# Patient Record
Sex: Male | Born: 1935 | Hispanic: No | Marital: Married | Smoking: Former smoker
Health system: Southern US, Community
[De-identification: ages and names within clinical notes are randomized; demographics above are authoritative.]

## PROBLEM LIST (undated history)

## (undated) DIAGNOSIS — R000 Tachycardia, unspecified: Secondary | ICD-10-CM

## (undated) DIAGNOSIS — I10 Essential (primary) hypertension: Secondary | ICD-10-CM

## (undated) DIAGNOSIS — E8881 Metabolic syndrome: Secondary | ICD-10-CM

## (undated) DIAGNOSIS — Z950 Presence of cardiac pacemaker: Secondary | ICD-10-CM

## (undated) DIAGNOSIS — I471 Supraventricular tachycardia, unspecified: Secondary | ICD-10-CM

## (undated) DIAGNOSIS — E785 Hyperlipidemia, unspecified: Secondary | ICD-10-CM

## (undated) DIAGNOSIS — R7303 Prediabetes: Secondary | ICD-10-CM

## (undated) DIAGNOSIS — D649 Anemia, unspecified: Secondary | ICD-10-CM

## (undated) DIAGNOSIS — I495 Sick sinus syndrome: Secondary | ICD-10-CM

## (undated) DIAGNOSIS — G4730 Sleep apnea, unspecified: Secondary | ICD-10-CM

## (undated) DIAGNOSIS — M353 Polymyalgia rheumatica: Secondary | ICD-10-CM

## (undated) DIAGNOSIS — I4891 Unspecified atrial fibrillation: Secondary | ICD-10-CM

## (undated) DIAGNOSIS — K922 Gastrointestinal hemorrhage, unspecified: Secondary | ICD-10-CM

## (undated) HISTORY — DX: Gastrointestinal hemorrhage, unspecified: K92.2

## (undated) HISTORY — DX: Metabolic syndrome: E88.810

## (undated) HISTORY — DX: Metabolic syndrome: E88.81

## (undated) HISTORY — DX: Tachycardia, unspecified: R00.0

## (undated) HISTORY — DX: Supraventricular tachycardia, unspecified: I47.10

## (undated) HISTORY — DX: Polymyalgia rheumatica: M35.3

## (undated) HISTORY — DX: Unspecified atrial fibrillation: I48.91

## (undated) HISTORY — DX: Hyperlipidemia, unspecified: E78.5

## (undated) HISTORY — DX: Supraventricular tachycardia: I47.1

## (undated) HISTORY — DX: Sick sinus syndrome: I49.5

---

## 1980-08-27 HISTORY — PX: CARPAL TUNNEL RELEASE: SHX101

## 1995-08-28 HISTORY — PX: KNEE SURGERY: SHX244

## 1998-08-27 HISTORY — PX: CARPAL TUNNEL RELEASE: SHX101

## 2006-01-22 ENCOUNTER — Ambulatory Visit: Payer: Self-pay | Admitting: Internal Medicine

## 2006-01-28 ENCOUNTER — Ambulatory Visit: Payer: Self-pay | Admitting: Internal Medicine

## 2006-04-24 ENCOUNTER — Ambulatory Visit: Payer: Self-pay | Admitting: Internal Medicine

## 2006-05-10 ENCOUNTER — Ambulatory Visit: Payer: Self-pay | Admitting: Internal Medicine

## 2006-11-12 ENCOUNTER — Ambulatory Visit: Payer: Self-pay | Admitting: Internal Medicine

## 2007-01-06 ENCOUNTER — Ambulatory Visit: Payer: Self-pay

## 2007-04-04 ENCOUNTER — Ambulatory Visit: Payer: Self-pay | Admitting: Internal Medicine

## 2007-05-28 ENCOUNTER — Other Ambulatory Visit: Payer: Self-pay

## 2007-05-28 ENCOUNTER — Ambulatory Visit: Payer: Self-pay | Admitting: Emergency Medicine

## 2007-06-04 ENCOUNTER — Ambulatory Visit: Payer: Self-pay | Admitting: Internal Medicine

## 2007-08-11 ENCOUNTER — Ambulatory Visit: Payer: Self-pay | Admitting: Family Medicine

## 2007-08-28 ENCOUNTER — Ambulatory Visit: Payer: Self-pay | Admitting: Family Medicine

## 2007-09-28 ENCOUNTER — Ambulatory Visit: Payer: Self-pay | Admitting: Family Medicine

## 2007-12-29 ENCOUNTER — Ambulatory Visit: Payer: Self-pay | Admitting: Internal Medicine

## 2007-12-29 LAB — CONVERTED CEMR LAB
Hemoglobin: 14.3 g/dL (ref 13.0–17.0)
Lymphocytes Relative: 21 % (ref 12–46)
Lymphs Abs: 1.5 10*3/uL (ref 0.7–4.0)
Monocytes Absolute: 0.5 10*3/uL (ref 0.1–1.0)
Monocytes Relative: 7 % (ref 3–12)
Neutro Abs: 5.1 10*3/uL (ref 1.7–7.7)
RBC: 4.82 M/uL (ref 4.22–5.81)

## 2008-01-07 ENCOUNTER — Ambulatory Visit: Payer: Self-pay | Admitting: Internal Medicine

## 2008-01-07 ENCOUNTER — Encounter: Payer: Self-pay | Admitting: Family Medicine

## 2008-01-07 ENCOUNTER — Ambulatory Visit: Payer: Self-pay

## 2008-02-13 ENCOUNTER — Ambulatory Visit: Payer: Self-pay | Admitting: Family Medicine

## 2008-04-15 ENCOUNTER — Ambulatory Visit: Payer: Self-pay | Admitting: Internal Medicine

## 2008-04-16 ENCOUNTER — Encounter: Payer: Self-pay | Admitting: Internal Medicine

## 2008-04-16 ENCOUNTER — Ambulatory Visit: Payer: Self-pay | Admitting: Cardiovascular Disease

## 2008-04-16 LAB — CONVERTED CEMR LAB
ALT: 14 units/L (ref 0–53)
AST: 24 units/L (ref 0–37)
BUN: 19 mg/dL (ref 6–23)
Calcium: 8.9 mg/dL (ref 8.4–10.5)
Creatinine, Ser: 0.85 mg/dL (ref 0.40–1.50)
HDL: 46 mg/dL (ref >39)
Hgb A1c MFr Bld: 6 % (ref 4.6–6.1)
Total Bilirubin: 1 mg/dL (ref 0.3–1.2)
Total CHOL/HDL Ratio: 2.5
VLDL: 12 mg/dL (ref 0–40)

## 2008-06-01 ENCOUNTER — Ambulatory Visit: Payer: Self-pay | Admitting: Internal Medicine

## 2008-06-28 ENCOUNTER — Ambulatory Visit: Payer: Self-pay | Admitting: Internal Medicine

## 2008-07-07 ENCOUNTER — Ambulatory Visit: Payer: Self-pay

## 2008-07-07 ENCOUNTER — Encounter: Payer: Self-pay | Admitting: Internal Medicine

## 2008-07-08 ENCOUNTER — Ambulatory Visit: Payer: Self-pay | Admitting: Internal Medicine

## 2008-11-05 ENCOUNTER — Ambulatory Visit: Payer: Self-pay | Admitting: Family Medicine

## 2008-12-03 ENCOUNTER — Encounter (INDEPENDENT_AMBULATORY_CARE_PROVIDER_SITE_OTHER): Payer: Self-pay

## 2008-12-16 ENCOUNTER — Encounter: Payer: Self-pay | Admitting: Internal Medicine

## 2008-12-16 ENCOUNTER — Ambulatory Visit: Payer: Self-pay | Admitting: Internal Medicine

## 2008-12-16 DIAGNOSIS — I4891 Unspecified atrial fibrillation: Secondary | ICD-10-CM | POA: Insufficient documentation

## 2008-12-16 DIAGNOSIS — E785 Hyperlipidemia, unspecified: Secondary | ICD-10-CM

## 2008-12-17 ENCOUNTER — Ambulatory Visit: Payer: Self-pay | Admitting: Internal Medicine

## 2008-12-17 LAB — CONVERTED CEMR LAB
ALT: 15 units/L (ref 0–53)
AST: 27 units/L (ref 0–37)
CO2: 24 meq/L (ref 19–32)
Creatinine, Ser: 0.86 mg/dL (ref 0.40–1.50)
HCT: 42.2 % (ref 39.0–52.0)
LDL Cholesterol: 74 mg/dL (ref 0–99)
MCV: 89.2 fL (ref 78.0–100.0)
Platelets: 217 10*3/uL (ref 150–400)
RDW: 14 % (ref 11.5–15.5)
Sodium: 141 meq/L (ref 135–145)
Total Bilirubin: 1.1 mg/dL (ref 0.3–1.2)
Total CHOL/HDL Ratio: 3.2
Total Protein: 7.3 g/dL (ref 6.0–8.3)
VLDL: 13 mg/dL (ref 0–40)
WBC: 5.2 10*3/uL (ref 4.0–10.5)

## 2009-01-14 DIAGNOSIS — E8881 Metabolic syndrome: Secondary | ICD-10-CM

## 2009-01-19 ENCOUNTER — Ambulatory Visit: Payer: Self-pay | Admitting: Internal Medicine

## 2009-01-19 ENCOUNTER — Encounter: Payer: Self-pay | Admitting: Internal Medicine

## 2009-01-26 ENCOUNTER — Encounter: Payer: Self-pay | Admitting: Internal Medicine

## 2009-01-26 ENCOUNTER — Ambulatory Visit: Payer: Self-pay

## 2009-03-07 ENCOUNTER — Encounter: Payer: Self-pay | Admitting: Internal Medicine

## 2009-03-07 ENCOUNTER — Ambulatory Visit: Payer: Self-pay | Admitting: Internal Medicine

## 2009-03-11 ENCOUNTER — Encounter: Payer: Self-pay | Admitting: Cardiology

## 2009-03-21 ENCOUNTER — Telehealth: Payer: Self-pay | Admitting: Internal Medicine

## 2009-03-28 ENCOUNTER — Encounter: Payer: Self-pay | Admitting: Internal Medicine

## 2009-05-30 ENCOUNTER — Ambulatory Visit: Payer: Self-pay | Admitting: Internal Medicine

## 2009-06-06 ENCOUNTER — Ambulatory Visit: Payer: Self-pay | Admitting: Internal Medicine

## 2009-06-06 ENCOUNTER — Encounter: Payer: Self-pay | Admitting: Internal Medicine

## 2009-06-09 LAB — CONVERTED CEMR LAB
ALT: 19 units/L (ref 0–53)
AST: 30 units/L (ref 0–37)
Albumin: 4.2 g/dL (ref 3.5–5.2)
Alkaline Phosphatase: 103 units/L (ref 39–117)
Cholesterol: 115 mg/dL (ref 0–200)
HDL: 36 mg/dL — ABNORMAL LOW (ref >39)
Indirect Bilirubin: 0.7 mg/dL (ref 0.0–0.9)
Total Protein: 6.9 g/dL (ref 6.0–8.3)
Triglycerides: 62 mg/dL (ref <150)

## 2009-08-17 ENCOUNTER — Encounter: Payer: Self-pay | Admitting: Internal Medicine

## 2009-12-05 ENCOUNTER — Ambulatory Visit: Payer: Self-pay | Admitting: Internal Medicine

## 2009-12-05 ENCOUNTER — Encounter: Payer: Self-pay | Admitting: Internal Medicine

## 2009-12-07 ENCOUNTER — Ambulatory Visit: Payer: Self-pay | Admitting: Internal Medicine

## 2009-12-07 DIAGNOSIS — R609 Edema, unspecified: Secondary | ICD-10-CM | POA: Insufficient documentation

## 2009-12-14 ENCOUNTER — Ambulatory Visit: Payer: Self-pay | Admitting: Cardiovascular Disease

## 2009-12-14 ENCOUNTER — Encounter: Payer: Self-pay | Admitting: Internal Medicine

## 2009-12-16 LAB — CONVERTED CEMR LAB
ALT: 14 units/L (ref 0–53)
AST: 22 units/L (ref 0–37)
Alkaline Phosphatase: 92 units/L (ref 39–117)
BUN: 23 mg/dL (ref 6–23)
Calcium: 8.7 mg/dL (ref 8.4–10.5)
Chloride: 106 meq/L (ref 96–112)
Creatinine, Ser: 0.84 mg/dL (ref 0.40–1.50)
HDL: 37 mg/dL — ABNORMAL LOW (ref >39)
Total Bilirubin: 1 mg/dL (ref 0.3–1.2)
Total CHOL/HDL Ratio: 3.1
VLDL: 12 mg/dL (ref 0–40)

## 2010-05-09 ENCOUNTER — Ambulatory Visit: Payer: Self-pay | Admitting: Family Medicine

## 2010-06-05 ENCOUNTER — Telehealth (INDEPENDENT_AMBULATORY_CARE_PROVIDER_SITE_OTHER): Payer: Self-pay | Admitting: *Deleted

## 2010-06-20 ENCOUNTER — Encounter: Payer: Self-pay | Admitting: Internal Medicine

## 2010-06-20 ENCOUNTER — Ambulatory Visit: Payer: Self-pay

## 2010-06-23 ENCOUNTER — Telehealth: Payer: Self-pay | Admitting: Internal Medicine

## 2010-06-27 ENCOUNTER — Ambulatory Visit: Payer: Self-pay | Admitting: Cardiovascular Disease

## 2010-06-30 ENCOUNTER — Ambulatory Visit: Payer: Self-pay | Admitting: Internal Medicine

## 2010-07-10 ENCOUNTER — Telehealth (INDEPENDENT_AMBULATORY_CARE_PROVIDER_SITE_OTHER): Payer: Self-pay | Admitting: *Deleted

## 2010-07-11 ENCOUNTER — Ambulatory Visit: Payer: Self-pay | Admitting: Cardiovascular Disease

## 2010-07-11 ENCOUNTER — Ambulatory Visit: Payer: Self-pay

## 2010-07-11 ENCOUNTER — Encounter: Payer: Self-pay | Admitting: Cardiovascular Disease

## 2010-07-11 ENCOUNTER — Encounter (HOSPITAL_COMMUNITY)
Admission: RE | Admit: 2010-07-11 | Discharge: 2010-08-26 | Payer: Self-pay | Source: Home / Self Care | Attending: Internal Medicine | Admitting: Internal Medicine

## 2010-07-14 ENCOUNTER — Ambulatory Visit: Payer: Self-pay | Admitting: Internal Medicine

## 2010-07-17 LAB — CONVERTED CEMR LAB
Chloride: 105 meq/L (ref 96–112)
Potassium: 4.9 meq/L (ref 3.5–5.3)
Pro B Natriuretic peptide (BNP): 79 pg/mL (ref 0.0–100.0)
Sodium: 140 meq/L (ref 135–145)

## 2010-08-16 ENCOUNTER — Telehealth: Payer: Self-pay | Admitting: Internal Medicine

## 2010-09-26 NOTE — Progress Notes (Signed)
Summary: Nuclear pre procedure  Phone Note Outgoing Call Call back at [facility] Phone [phone]   Call placed by: Allen Kell, RT-N  July 10, 2010 3:17 PM Call placed to: Patient Summary of Call: Reviewed information on Myoview Information Sheet (see scanned document for further details).  Spoke with patient.      Nuclear Med Background Indications for Stress Test: Evaluation for Ischemia  Indications Comments: Abnormal Echo  History: Ablation, Echo, Pacemaker  History Comments: 10/11 Echo:EF=45-50%; h/o afib.     Nuclear Pre-Procedure Cardiac Risk Factors: History of Smoking, Lipids Height (in): 74   Appended Document: Cardiology Nuclear Testing please have them pull images for me so I can review in [place] and make a decision. thanks.

## 2010-09-26 NOTE — Progress Notes (Signed)
Summary: CALLED PT  Phone Note Outgoing Call Call back at [facility] Phone [phone]   Call placed by: Harlon Flor,  June 05, 2010 11:02 AM Call placed to: Patient Summary of Call: [facility] TO SCHEDULE ECHO Initial call taken by: Harlon Flor,  June 05, 2010 11:02 AM

## 2010-09-26 NOTE — Assessment & Plan Note (Signed)
Summary: F6M/AMD   Visit Type:  Follow-up Primary Provider:  Dr. Zada Finders  CC:  6 month reck..  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 Deno Lunger at [facility]. Followed by placement of permanent pacemaker.  December 2009 had a f/u ablation of a left-sided and right-sided ectopic atrial trachycardia. He now has permanent atrial fib.  Remainder of his history is notable for a previous GI bleed likely due to colonic AVMs, which were coiled by Interventional Radiology, polymyalgia rheumatica, metabolic syndrome, glucose intolerance, and hyperlipidemia.   He returns for routine f/u. Doing well. Walking regularly with no CP or SOB. Does have mild edema. Only complaint since we last saw him is sore R breast. Has had very throrough work-up and being followed by a Careers adviser. Has gained a few pounds since we last saw him. No palpitations, syncope or presyncope.   Echo last week EF 45-50% with global HK. RV normal. mild to moderate TR with PAH  ~77mmHG. Wife notices since he has gained weight he is back snoring more. No CP or dyspnea. Recent pacer check showed AV pacing 18% of time with episodes of AF with RVR. + headaches.  No longer taking lasix as it wasn't working for him.   Has not had recent stres test.   Current Medications (verified): 1)  Centrum Silver  Tabs (Multiple Vitamins-Minerals) .Marland Kitchen.. 1 By Mouth Daily 2)  Metamucil Plus Calcium  Caps (Psyllium-Calcium) .... 2 By Mouth Daily 3)  Omeprazole 20 Mg Cpdr (Omeprazole) .Marland Kitchen.. 1 By Mouth Daily 4)  Simvastatin 20 Mg Tabs (Simvastatin) .Marland Kitchen.. 1po Daily 5)  Lutein 6 Mg Caps (Lutein) .Marland Kitchen.. 1 By Mouth Daily 6)  Fish Oil 1000 Mg Caps (Omega-3 Fatty Acids) .... 2 By Mouth Daily 7)  Aspirin 325 Mg  Tabs (Aspirin) .Marland Kitchen.. 1 By Mouth Once Daily 8)  Metoprolol Succinate 100 Mg Xr24h-Tab (Metoprolol Succinate) .... Take One Tablet By Mouth Daily 9)   Furosemide 20 Mg Tabs (Furosemide) .... Take One Tablet By Mouth Daily. 10)  Klor-Con M20 20 Meq Cr-Tabs (Potassium Chloride Crys Cr) .Marland Kitchen.. 1 Tab Daily  Allergies (verified): No Known Drug Allergies  Past History:  Past Medical History: Last updated: 01/14/2009 SVT/ PSVT/ PAT (ICD-427.0) TACHYCARDIA (ICD-785) SICK SINUS/ TACHY-BRADY SYNDROME (ICD-427.81) METABOLIC SYNDROME X (ICD-277.7) HYPERLIPIDEMIA (ICD-272.4) ATRIAL FIBRILLATION (ICD-427.31) h/o GIB with colonic AVMs s/p coiling PMR  Past Surgical History: Last updated: 01/14/2009 R carpal tunnel - '82 L knee - '97 L carpal tunnel - '00  Family History: Last updated: 01/14/2009 Family History of Cancer:  Family History of Coronary Artery Disease:   Social History: Last updated: 01/14/2009 Retired  Married  Tobacco Use - Former. '76 Alcohol Use - no Regular Exercise - yes Drug Use - no  Risk Factors: Exercise: yes (01/14/2009)  Risk Factors: Smoking Status: quit (01/14/2009)  Review of Systems       As per HPI and past medical history; otherwise all systems negative.   Vital Signs:  Patient profile:   77 year old male Height:      74 inches Weight:      257 pounds BMI:     33.12 Pulse rate:   71 / minute BP sitting:   120 / 72  (right arm) Cuff size:   large  Vitals Entered By: Bishop Dublin, CMA (June 30, 2010 10:34 AM)  Physical Exam  General:  W ell appearing.  no resp difficulty HEENT: normal Neck: supple. JVP hard to see ? 8. Carotids 2+ bilat; no bruits. No lymphadenopathy or thryomegaly appreciated. Cor: PMI nondisplaced. Irregular rate & rhythm. No rubs, gallops, murmur. Lungs: clear Abdomen: soft, nontender, nondistended. No hepatosplenomegaly. No bruits or masses. Good bowel sounds. Extremities: no cyanosis, clubbing, rash, 2+ edema Neuro: alert & orientedx3, cranial nerves grossly intact. moves all 4 extremities w/o difficulty. affect pleasant    PPM Specifications Following  MD:  Sherryl Manges, MD     PPM Vendor:  Medtronic     PPM Model Number:  ADDR01     PPM Serial Number:  [id number] PPM DOI:  04/05/2008     PPM Implanting MD:  Sherryl Manges, MD  Lead 1    Location: RA     DOI: 04/05/2008     Model #: 7628     Serial #: [id number]     Status: active Lead 2    Location: RV     DOI: 04/05/2008     Model #: 3710     Serial #: [id number]     Status: active  Magnet Response Rate:  BOL 85 ERI  65  Indications:  SVT/Ablation   PPM Follow Up Pacer Dependent:  No      Episodes Coumadin:  No  Parameters Mode:  DDDR+     Lower Rate Limit:  60     Upper Rate Limit:  120 Paced AV Delay:  200     Sensed AV Delay:  180  Impression & Recommendations:  Problem # 1:  ATRIAL FIBRILLATION (ICD-427.31) Having episodes of AF with RVR. Increase toprol to 150 daily. Not coumadin candidate due to AVMs.  Problem # 2:  Abnormal echo Echo with mildly decreased LV function. Suspect may be due to AF with RVR.  Increasing toprol; will need stress test to exclude development of CAD.  Problem # 3:  Pulmonary HTN by echo Suspect combination of OSA and volume overload. Have suggested he lose 10-15 punds otherwise will need sleep study. Will prescribe lasix 40mg  daily on MWF with kcl 20. Check BMET and BNP in 2 weeks. Cut back on fluid intake. Watch renal function closely.   Patient Instructions: 1)  Your physician has recommended you make the following change in your medication: INCREASE metoprolol 150mg  daily START lasix and potassium M/W/F 2)  Your physician wants you to follow-up in:   3 months You will receive a reminder letter in the mail two months in advance. If you don't receive a letter, please call our office to schedule the follow-up appointment. 3)  Your physician has requested that you have an exercise stress myoview.  For further information please visit [email or web address]  Please follow instruction sheet, as given. 4)  Your physician recommends that you return for  lab work in: 2 weeks (BMP/BNP) Prescriptions: METOPROLOL SUCCINATE 100 MG XR24H-TAB (METOPROLOL SUCCINATE) Take 1 1/2  tablets by mouth daily  #135 x 3   Entered by:   Benedict Needy, RN   Authorized by:   Dolores Patty, MD, [facility]   Signed by:   Benedict Needy, RN on 06/30/2010   Method used:   Faxed to ...       [facility] Environmental education officer)       [address] [address]       [place], [place]  [address]       Ph: [phone]       Fax: [phone]   RxID:   [id number] FUROSEMIDE 40 MG  TABS (FUROSEMIDE) Take one tablet by mouth Monday, Wednesday and Friday.  #15 x 6   Entered by:   Benedict Needy, RN   Authorized by:   Dolores Patty, MD, [facility]   Signed by:   Benedict Needy, RN on 06/30/2010   Method used:   Electronically to        [facility]  [address] (retail)       [address]       [place], [place]  [address]       Ph: [phone]       Fax: [phone]   RxID:   [id number] KLOR-CON M20 20 MEQ CR-TABS (POTASSIUM CHLORIDE CRYS CR) Take 1 tablet by mouth monday, wednesday and friday.  #15 x 6   Entered by:   Benedict Needy, RN   Authorized by:   Dolores Patty, MD, [facility]   Signed by:   Benedict Needy, RN on 06/30/2010   Method used:   Electronically to        [facility]  [address] (retail)       [address]       [place], [place]  [address]       Ph: [phone]       Fax: [phone]   RxID:   [id number] METOPROLOL SUCCINATE 100 MG XR24H-TAB (METOPROLOL SUCCINATE) Take 1 1/2  tablets by mouth daily  #110 x 3   Entered by:   Benedict Needy, RN   Authorized by:   Dolores Patty, MD, [facility]   Signed by:   Benedict Needy, RN on 06/30/2010   Method used:   Printed then faxed to ...       [facility] Environmental education officer)       [address] [address]       [place], [place]  [address]       Ph: [phone]       Fax: [phone]   RxID:   [id number]

## 2010-09-26 NOTE — Cardiovascular Report (Signed)
Summary: Office Visit   Office Visit   Imported By: Roderic Ovens 12/13/2009 15:46:45  _____________________________________________________________________  External Attachment:    Type:   Image     Comment:   External Document

## 2010-09-26 NOTE — Assessment & Plan Note (Signed)
Summary: f48m   Visit Type:  Follow-up Primary Provider:  Dr. Zada Finders  CC:  no complaints.  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 Deno Lunger at [facility] about 2 years ago. Followed by placement of permanent pacemaker.  December 2009 had a f/u ablation of a left-sided and right-sided ectopic atrial trachycardia. He now has permanent atrial fib.  Remainder of his history is notable for a previous GI bleed likely due to colonic AVMs, which were coiled by Interventional Radiology, polymyalgia rheumatica, metabolic syndrome, glucose intolerance, and hyperlipidemia.   He returns for routine f/u. Doing well. Walking regularly with no CP or SOB. Does have mild edema. No orthopnea or PND. No palpitations. No presyncope. Recent pacer check showed chronic AF with VR > 120 only 6.6% of time.   Current Medications (verified): 1)  Centrum Silver  Tabs (Multiple Vitamins-Minerals) .Marland Kitchen.. 1 By Mouth Daily 2)  Metamucil Plus Calcium  Caps (Psyllium-Calcium) .... 2 By Mouth Daily 3)  Omeprazole 20 Mg Cpdr (Omeprazole) .Marland Kitchen.. 1 By Mouth Daily 4)  Simvastatin 20 Mg Tabs (Simvastatin) .Marland Kitchen.. 1po Daily 5)  Lutein 6 Mg Caps (Lutein) .Marland Kitchen.. 1 By Mouth Daily 6)  Fish Oil 1000 Mg Caps (Omega-3 Fatty Acids) .... 2 By Mouth Daily 7)  Aspirin 325 Mg  Tabs (Aspirin) .Marland Kitchen.. 1 By Mouth Once Daily 8)  Metoprolol Succinate 100 Mg Xr24h-Tab (Metoprolol Succinate) .... Take One Tablet By Mouth Daily  Allergies (verified): No Known Drug Allergies  Past History:  Past Medical History: Last updated: 01/14/2009 SVT/ PSVT/ PAT (ICD-427.0) TACHYCARDIA (ICD-785) SICK SINUS/ TACHY-BRADY SYNDROME (ICD-427.81) METABOLIC SYNDROME X (ICD-277.7) HYPERLIPIDEMIA (ICD-272.4) ATRIAL FIBRILLATION (ICD-427.31) h/o GIB with colonic AVMs s/p coiling PMR  Review of Systems       As per HPI and past medical history; otherwise all  systems negative.   Vital Signs:  Patient profile:   77 year old male Height:      74 inches Weight:      254.25 pounds BMI:     32.76 Pulse rate:   64 / minute Pulse rhythm:   regular BP sitting:   116 / 82  (left arm) Cuff size:   large  Vitals Entered By: Charlena Cross, RN, BSN (December 07, 2009 2:21 PM)  Physical Exam  General:  General:  Gen: well appearing. no resp difficulty HEENT: normal Neck: supple. no JVD. Carotids 2+ bilat; no bruits. No lymphadenopathy or thryomegaly appreciated. Cor: PMI nondisplaced. Irregular rate & rhythm. No rubs, gallops, murmur. Lungs: clear Abdomen: soft, nontender, nondistended. No hepatosplenomegaly. No bruits or masses. Good bowel sounds. Extremities: no cyanosis, clubbing, rash, 2+ edema Neuro: alert & orientedx3, cranial nerves grossly intact. moves all 4 extremities w/o difficulty. affect pleasant    PPM Specifications Following MD:  Sherryl Manges, MD     PPM Vendor:  [employer]     PPM Model Number:  ADDR01     PPM Serial Number:  [id number] [facility] PPM DOI:  04/05/2008     PPM Implanting MD:  Sherryl Manges, MD  Lead 1    Location: RA     DOI: 04/05/2008     Model #: 4098     Serial #: [id number]     Status: active Lead 2    Location: RV     DOI: 04/05/2008     Model #: 6213     Serial #: [id number]  Status: active  Magnet Response Rate:  BOL 85 ERI  65  Indications:  SVT/Ablation   PPM Follow Up Pacer Dependent:  No      Episodes Coumadin:  No  Parameters Mode:  DDDR+     Lower Rate Limit:  60     Upper Rate Limit:  120 Paced AV Delay:  200     Sensed AV Delay:  180  Impression & Recommendations:  Problem # 1:  ATRIAL FIBRILLATION (ICD-427.31) This is stable. Rate control is adequate. Not coumadin candidate due to GIB. Continue asa. Repeat echo in 6 months.  Problem # 2:  HYPERLIPIDEMIA (ICD-272.4) Due for repeat lipids and liver panel.   Problem # 3:  EDEMA (ICD-782.3) Likely dependent edema. lasix 20/kcl 20prn.  Check electrolytes next week. if taking more than 1x/week recheck electrolytes in 1 month.Keep feet elevated.  Other Orders: Future Orders: Echocardiogram (Echo) ... 05/29/2010  Patient Instructions: 1)  Your physician recommends that you schedule a follow-up appointment in: 6 months 2)  Your physician recommends that you return for lab work in:1 month if you are taking alot of lasix (please call to make this appt) 3)  Your physician has recommended you make the following change in your medication: start lasix 20 mg daily as needed,  take potassium with your lasix 4)  Your physician recommends that you return for a FASTING lipid profile: in 1 week (cmet, lipids) Prescriptions: KLOR-CON M20 20 MEQ CR-TABS (POTASSIUM CHLORIDE CRYS CR) 1 tab daily  #30 x 0   Entered by:   Charlena Cross, RN, BSN   Authorized by:   Dolores Patty, MD, [facility]   Signed by:   Charlena Cross, RN, BSN on 12/07/2009   Method used:   Electronically to        [facility]  [address] (retail)       [address]       [place], [place]  [address]       Ph: [phone]       Fax: [phone]   RxID:   [id number] FUROSEMIDE 20 MG TABS (FUROSEMIDE) Take one tablet by mouth daily.  #30 x 0   Entered by:   Charlena Cross, RN, BSN   Authorized by:   Dolores Patty, MD, [facility]   Signed by:   Charlena Cross, RN, BSN on 12/07/2009   Method used:   Electronically to        [facility] (retail)       [address]       [place], [place]  [address]       Ph: [phone]       Fax: [phone]   RxID:   [id number] SIMVASTATIN 20 MG TABS (SIMVASTATIN) 1po daily  #90 x 3   Entered by:   Charlena Cross, RN, BSN   Authorized by:   Dolores Patty, MD, [facility]   Signed by:   Charlena Cross, RN, BSN on 12/07/2009   Method used:   Print then Give to Patient   RxID:   [id number] SIMVASTATIN 20 MG TABS (SIMVASTATIN) 1po daily  #90 x  3   Entered by:   Charlena Cross, RN, BSN   Authorized by:   Dolores Patty, MD, [facility]   Signed by:   Charlena Cross, RN, BSN on 12/07/2009   Method used:   Faxed to .Marland KitchenMarland Kitchen  [facility] Environmental education officer)       [address] [address]       [place], [place]  [address]       Ph: [phone]       Fax: [phone]   RxID:   [id number]

## 2010-09-26 NOTE — Procedures (Signed)
Summary: PACER/AMD   Current Medications (verified): 1)  Centrum Silver  Tabs (Multiple Vitamins-Minerals) .Marland Kitchen.. 1 By Mouth Daily 2)  Metamucil Plus Calcium  Caps (Psyllium-Calcium) .... 2 By Mouth Daily 3)  Omeprazole 20 Mg Cpdr (Omeprazole) .Marland Kitchen.. 1 By Mouth Daily 4)  Simvastatin 20 Mg Tabs (Simvastatin) .Marland Kitchen.. 1po Daily 5)  Lutein 6 Mg Caps (Lutein) .Marland Kitchen.. 1 By Mouth Daily 6)  Fish Oil 1000 Mg Caps (Omega-3 Fatty Acids) .... 2 By Mouth Daily 7)  Aspirin 325 Mg  Tabs (Aspirin) .Marland Kitchen.. 1 By Mouth Once Daily 8)  Metoprolol Succinate 100 Mg Xr24h-Tab (Metoprolol Succinate) .... Take One Tablet By Mouth Daily 9)  Klor-Con M20 20 Meq Cr-Tabs (Potassium Chloride Crys Cr) .Marland Kitchen.. 1 Tab Daily  Allergies (verified): No Known Drug Allergies   PPM Specifications Following MD:  [name] [name]     PPM Vendor:  [employer]     PPM Model Number:  ADDR01     PPM Serial Number:  [id number] H PPM DOI:  04/05/2008     PPM Implanting MD:  [name] Manges, MD  Lead 1    Location: RA     DOI: 04/05/2008     Model #: 1093     Serial #: [id number]     Status: active Lead 2    Location: RV     DOI: 04/05/2008     Model #: 5427     Serial #: [id number]     Status: active  Magnet Response Rate:  BOL 85 ERI  65  Indications:  SVT/Ablation   PPM Follow Up Remote Check?  No Battery Voltage:  2.79 V     Battery Est. Longevity:  9 years     Pacer Dependent:  No       PPM Device Measurements Atrium  Amplitude: 2.0 mV, Impedance: 496 ohms,  Right Ventricle  Amplitude: 8.0 mV, Impedance: 650 ohms, Threshold: 0.875 V at 0.4 msec  Episodes MS Episodes:  3744     Percent Mode Switch:  95.1%     Coumadin:  No Ventricular High Rate:  6     Atrial Pacing:  17.7%     Ventricular Pacing:  17.7%  Parameters Mode:  DDDR+     Lower Rate Limit:  60     Upper Rate Limit:  120 Paced AV Delay:  200     Sensed AV Delay:  180 Tech Comments:  A-fib with RVR noted., - coumadin.  No parameter changes.  Device function normal.  I will  discuss this with Dr. Gala [name].  [name] [name] is scheduled to see him 11/4.  ROV 6 months [place] [facility]. [name] Harm, LPN  June 27, 2010 3:12 PM  .

## 2010-09-26 NOTE — Progress Notes (Signed)
Summary: Bedelia Person, MD  Bedelia Person, MD   Imported By: Harlon Flor 08/31/2009 08:59:53  _____________________________________________________________________  External Attachment:    Type:   Image     Comment:   External Document

## 2010-09-26 NOTE — Procedures (Signed)
Summary: pacer check   Current Medications (verified): 1)  Centrum Silver  Tabs (Multiple Vitamins-Minerals) .Marland Kitchen.. 1 By Mouth Daily 2)  Metamucil Plus Calcium  Caps (Psyllium-Calcium) .... 2 By Mouth Daily 3)  Omeprazole 20 Mg Cpdr (Omeprazole) .Marland Kitchen.. 1 By Mouth Daily 4)  Simvastatin 20 Mg Tabs (Simvastatin) .Marland Kitchen.. 1po Daily 5)  Lutein 6 Mg Caps (Lutein) .Marland Kitchen.. 1 By Mouth Daily 6)  Fish Oil 1000 Mg Caps (Omega-3 Fatty Acids) .... 2 By Mouth Daily 7)  Aspirin 325 Mg  Tabs (Aspirin) .Marland Kitchen.. 1 By Mouth Once Daily 8)  Metoprolol Succinate 100 Mg Xr24h-Tab (Metoprolol Succinate) .... Take One Tablet By Mouth Daily  Allergies (verified): No Known Drug Allergies   PPM Specifications Following MD:  Sherryl Manges, MD     PPM Vendor:  [employer]     PPM Model Number:  ADDR01     PPM Serial Number:  [id number] H PPM DOI:  04/05/2008     PPM Implanting MD:  Sherryl Manges, MD  Lead 1    Location: RA     DOI: 04/05/2008     Model #: 6962     Serial #: [id number]     Status: active Lead 2    Location: RV     DOI: 04/05/2008     Model #: 0102     Serial #: [id number]     Status: active  Magnet Response Rate:  BOL 85 ERI  65  Indications:  SVT/Ablation   PPM Follow Up Remote Check?  No Battery Voltage:  2.79 V     Battery Est. Longevity:  6 years     Pacer Dependent:  No       PPM Device Measurements Atrium  Amplitude: 1.0 mV, Impedance: 505 ohms,  Right Ventricle  Amplitude: 5.6 mV, Impedance: 623 ohms, Threshold: 0.75 V at 0.4 msec  Episodes MS Episodes:  1715     Percent Mode Switch:  99%     Coumadin:  No Ventricular High Rate:  3     Atrial Pacing:  17.1%     Ventricular Pacing:  16.8%  Parameters Mode:  DDDR+     Lower Rate Limit:  60     Upper Rate Limit:  120 Paced AV Delay:  200     Sensed AV Delay:  180 Tech Comments:  No parameter changes.  A-fib, chronic - coumadin.  6.6% heart rates > 120bpm.  There were 3 VHR episodes the longest 1:49 minutes.  He will follow up in 6 months. [name],  LPN  December 05, 2009 9:03 AM

## 2010-09-26 NOTE — Progress Notes (Signed)
Summary: CALLING BACK  Phone Note Call from Patient Call back at Home Phone [phone]   Caller: SELF Call For: BENSIMHON Summary of Call: RETURNING ERIKA'S PHONE CALL REGARDING ULTRASOUND RESULTS Initial call taken by: Harlon Flor,  June 23, 2010 2:27 PM  Follow-up for Phone Call        Attempted to call pt back.  LMOM TCB. Cloyde Reams RN  June 23, 2010 2:32 PM   Called spoke with pt advised ECHO results OK per Dr Gala Romney, EF low normal.  Pt aware.  Follow-up by: Cloyde Reams RN,  June 23, 2010 4:15 PM

## 2010-09-28 NOTE — Progress Notes (Signed)
Summary: Results  Phone Note Call from Patient Call back at Home Phone [phone]   Caller: Self Call For: Fines Kimberlin Summary of Call: Pt has not heard the results of last labs or Stress Test.  He has requested that I fax it to [facility] Attention: Dr. Zada Finders.  Fax #[phone] Initial call taken by: Harlon Flor,  August 16, 2010 11:29 AM  Follow-up for Phone Call        Please see stress test result note from 07/11/10 and call pt, thanks. Follow-up by: Lanny Hurst RN,  August 16, 2010 3:58 PM

## 2010-09-28 NOTE — Assessment & Plan Note (Signed)
Summary: Cardiology Nuclear Testing  Nuclear Med Background Indications for Stress Test: Evaluation for Ischemia  Indications Comments: Abnormal Echo  History: Ablation, Echo, Pacemaker  History Comments: 10/11 Echo:EF=45-50%; h/o afib.  Symptoms: DOE, Palpitations, Rapid HR    Nuclear Pre-Procedure Cardiac Risk Factors: History of Smoking, Lipids Caffeine/Decaff Intake: None NPO After: 9:00 PM Lungs: clear IV 0.9% NS with Angio Cath: 22g     IV Site: R Hand IV Started by: Irean Hong, RN Chest Size (in) 46     Height (in): 74 Weight (lb): 246 BMI: 31.70 Tech Comments: Held metoprolol 48 hrs.  Nuclear Med Study 1 or 2 day study:  1 day     Stress Test Type:  Treadmill/Lexiscan Reading MD:  Charlton Haws, MD     Referring MD:  D.Bensimhon Resting Radionuclide:  Technetium 24m Tetrofosmin     Resting Radionuclide Dose:  11 mCi  Stress Radionuclide:  Technetium 44m Tetrofosmin     Stress Radionuclide Dose:  33 mCi   Stress Protocol  Max Systolic BP: 136 mm Hg   Stress Test Technologist:  Milana Na, EMT-P     Nuclear Technologist:  Doyne Keel, CNMT  Rest Procedure  Myocardial perfusion imaging was performed at rest 45 minutes following the intravenous administration of Technetium 58m Tetrofosmin.  Stress Procedure  The patient received IV Lexiscan 0.4 mg over 15-seconds with concurrent low level exercise and then Technetium 34m Tetrofosmin was injected at 30-seconds while the patient continued walking one more minute.  There were no significant changes with Lexiscan.  Quantitative spect images were obtained after a 45 minute delay.  QPS Raw Data Images:  Motion worse on stress images Stress Images:  Apical thinning Rest Images:  Apical thinning Subtraction (SDS):  SDS 1 Transient Ischemic Dilatation:  1.07  (Normal <1.22)  Lung/Heart Ratio:  0.31  (Normal <0.45)  Quantitative Gated Spect Images QGS EDV:  132 ml QGS ESV:  65 ml QGS EF:  51 % QGS cine  images:  Apical hypokinesis  Findings Low risk nuclear study      Overall Impression  Exercise Capacity: Lexiscan with no exercise. BP Response: Normal blood pressure response. Clinical Symptoms: "Winded" ECG Impression: Low voltage Afib /Flutter Overall Impression: Low risk: no ischemia  Thinning of apex and inferior wall.  Apical hypokinesis seems prominant on surface images suggest echo or MRI correlation  Appended Document: Cardiology Nuclear Testing please have them pull images for me so I can review in [place] and make a decision. thanks.  Appended Document: Cardiology Nuclear Testing Dr Gala Romney reviewed images, he will think about it and let you know what to do  Appended Document: Cardiology Nuclear Testing I reviewed. Seems more like diaphragmatic attenuation. I feel it is low-risk. Continue current rx.   Appended Document: Cardiology Nuclear Testing Attempted to call pt, LMOM TCB /MES  Appended Document: Cardiology Nuclear Testing pt aware of results. /MES

## 2010-10-03 ENCOUNTER — Ambulatory Visit (INDEPENDENT_AMBULATORY_CARE_PROVIDER_SITE_OTHER): Payer: Medicare Other | Admitting: Internal Medicine

## 2010-10-03 ENCOUNTER — Encounter: Payer: Self-pay | Admitting: Internal Medicine

## 2010-10-03 DIAGNOSIS — R609 Edema, unspecified: Secondary | ICD-10-CM

## 2010-10-03 DIAGNOSIS — R9389 Abnormal findings on diagnostic imaging of other specified body structures: Secondary | ICD-10-CM

## 2010-10-03 DIAGNOSIS — I4891 Unspecified atrial fibrillation: Secondary | ICD-10-CM

## 2010-10-12 NOTE — Assessment & Plan Note (Signed)
Summary: F3M/AMD   Visit Type:  Follow-up Primary Provider:  Dr. Zada Finders  CC:  "Doing well".  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 Deno Lunger at [place]. Followed by placement of permanent pacemaker.  December 2009 had a f/u ablation of a left-sided and right-sided ectopic atrial trachycardia. He now has permanent atrial fib.  Remainder of his history is notable for a previous GI bleed likely due to colonic AVMs, which were coiled by Interventional Radiology, polymyalgia rheumatica, metabolic syndrome, glucose intolerance, and hyperlipidemia.   He returns for routine f/u. Doing well. Walking regularly with no CP or SOB. Does have mild edema. Only complaint since we last saw him is sore R breast. Has had very throrough work-up and being followed by a Careers adviser. Has gained a few pounds since we last saw him. No palpitations, syncope or presyncope.   Had echo last year with EF 45-50% with global HK. RV normal. mild to moderate TR with PAH  ~73mmHG. Had f/u nuclear study in November 2011. Showed EF 51% with thinning of apex and inferior wall. I reviewed and felt it was low risk scan.   Feels well walking every day. No CP or shortness of breath. + LE edema and takes lasix 3x/week. No rothopne or PND. No problems with ASA.   Current Medications (verified): 1)  Centrum Silver  Tabs (Multiple Vitamins-Minerals) .Marland Kitchen.. 1 By Mouth Daily 2)  Metamucil Plus Calcium  Caps (Psyllium-Calcium) .... 2 By Mouth Daily 3)  Omeprazole 20 Mg Cpdr (Omeprazole) .Marland Kitchen.. 1 By Mouth Daily 4)  Simvastatin 20 Mg Tabs (Simvastatin) .Marland Kitchen.. 1po Daily 5)  Lutein 6 Mg Caps (Lutein) .Marland Kitchen.. 1 By Mouth Daily 6)  Fish Oil 1000 Mg Caps (Omega-3 Fatty Acids) .... 2 By Mouth Daily 7)  Aspirin 325 Mg  Tabs (Aspirin) .Marland Kitchen.. 1 By Mouth Once Daily 8)  Metoprolol Succinate 100 Mg Xr24h-Tab (Metoprolol Succinate) .... Take 1 1/2   Tablets By Mouth Daily 9)  Klor-Con M20 20 Meq Cr-Tabs (Potassium Chloride Crys Cr) .... Take 1 Tablet By Mouth Monday, Wednesday and Friday. 10)  Furosemide 40 Mg Tabs (Furosemide) .... Take One Tablet By Mouth Monday, Wednesday and Friday.  Allergies (verified): No Known Drug Allergies  Past History:  Past Medical History: Last updated: 01/14/2009 SVT/ PSVT/ PAT (ICD-427.0) TACHYCARDIA (ICD-785) SICK SINUS/ TACHY-BRADY SYNDROME (ICD-427.81) METABOLIC SYNDROME X (ICD-277.7) HYPERLIPIDEMIA (ICD-272.4) ATRIAL FIBRILLATION (ICD-427.31) h/o GIB with colonic AVMs s/p coiling PMR  Past Surgical History: Last updated: 01/14/2009 R carpal tunnel - '82 L knee - '97 L carpal tunnel - '00  Family History: Last updated: 01/14/2009 Family History of Cancer:  Family History of Coronary Artery Disease:   Social History: Last updated: 01/14/2009 Retired  Married  Tobacco Use - Former. '76 Alcohol Use - no Regular Exercise - yes Drug Use - no  Risk Factors: Exercise: yes (01/14/2009)  Risk Factors: Smoking Status: quit (01/14/2009)  Review of Systems       As per HPI and past medical history; otherwise all systems negative.   Vital Signs:  Patient profile:   77 year old male Height:      74 inches Weight:      252 pounds BMI:     32.47 Pulse rate:   74 / minute BP sitting:   112 / 71  (left arm) Cuff size:   regular  Vitals Entered By: Bishop Dublin, CMA (October 03, 2010 4:10 PM)  Physical Exam  General:  Well appearing. no resp difficulty HEENT: normal Neck: supple. JVP hard to see ?6-7. Carotids 2+ bilat; no bruits. No lymphadenopathy or thryomegaly appreciated. Cor: PMI nondisplaced. Irregular rate & rhythm. No rubs, gallops, murmur. Lungs: clear Abdomen: soft, nontender, nondistended. No hepatosplenomegaly. No bruits or masses. Good bowel sounds. Extremities: no cyanosis, clubbing, rash, 1+ edema Neuro: alert & orientedx3, cranial nerves grossly intact.  moves all 4 extremities w/o difficulty. affect pleasant    PPM Specifications Following MD:  Mar Daring     PPM Vendor:  Medtronic     PPM Model Number:  [id number]     PPM Serial Number:  [id number] PPM DOI:  04/05/2008     PPM Implanting MD:  Sherryl Manges, MD  Lead 1    Location: RA     DOI: 04/05/2008     Model #: [id number]     Serial #: [id number]     Status: active Lead 2    Location: RV     DOI: 04/05/2008     Model #: [id number]     Serial #: [id number]     Status: active  Magnet Response Rate:  BOL 85 ERI  65  Indications:  SVT/Ablation   PPM Follow Up Pacer Dependent:  No      Episodes Coumadin:  No  Parameters Mode:  DDDR+     Lower Rate Limit:  60     Upper Rate Limit:  120 Paced AV Delay:  200     Sensed AV Delay:  180  Impression & Recommendations:  Problem # 1:  ABNORMAL ECHO We reviewed his stress test and I suspect inferior defect may be artifactual. In either case, the sutdy is low risk and without significant CP or dyspnea would not pursue cardiac cath at this time. Should he develop symptoms would have low threshold for cath.   Problem # 2:  ATRIAL FIBRILLATION (ICD-427.31) Chronic. Rate controlled. Continued current regimen.  Not coumadin candidate due to AVMs.  Problem # 3:  EDEMA (ICD-782.3) Slightly improved. Continue to take lasix aas needed. Consider compression hose.   Patient Instructions: 1)  Your physician recommends that you schedule a follow-up appointment in: 6 months 2)  Your physician recommends that you continue on your current medications as directed. Please refer to the Current Medication list given to you today.  Prevention & Chronic Care Immunizations   Influenza vaccine: Not documented    Tetanus booster: Not documented    Pneumococcal vaccine: Not documented    H. zoster vaccine: Not documented  Colorectal Screening   Hemoccult: Not documented    Colonoscopy: Not documented  Other Screening   PSA: Not documented   Smoking  status: quit  (01/14/2009)  Lipids   Total Cholesterol: 114  (12/14/2009)   LDL: 65  (12/14/2009)   LDL Direct: Not documented   HDL: 37  (12/14/2009)   Triglycerides: 62  (12/14/2009)    SGOT (AST): 22  (12/14/2009)   SGPT (ALT): 14  (12/14/2009)   Alkaline phosphatase: 92  (12/14/2009)   Total bilirubin: 1.0  (12/14/2009)  Self-Management Support :    Lipid self-management support: Not documented

## 2011-01-09 NOTE — Assessment & Plan Note (Signed)
[name] [name] OFFICE NOTE   NAME:[name], [name]                       MRN:          [id number]  DATE:06/01/2008                            DOB:          1931-10-23    INTERVAL HISTORY:  [name] [name] is a very pleasant 77 year old male with  history of tachybrady syndrome, status post ablation of a reentrant SVT  by Dr. Deno Lunger at [place].  This was complicated by a GI bleed due to  what sounds like colonic AVMs, which were coiled by Interventional  Radiology.  He is also status post Medtronic pacemaker couple of months  ago due to symptomatic sick sinus syndrome.   Remainder of his medical history is notable for polymyalgia rheumatica,  metabolic syndrome, glucose intolerance, and hyperlipidemia.  He returns  today for routine followup.   Overall, he is doing quite well.  He has noticed that his heart rate has  been mostly in the 100-115 range.  This is asymptomatic for him.  He has  increased his Toprol from 25 a day augmented to a 100 and really has not  noticed much of a decrease in his heart rate.  However, he feels fine.  He does not feel dizzy.  He is very active, walking once or twice a day  without any chest pain, shortness of breath, or dizziness.   CURRENT MEDICATIONS:  Multivitamin, Metamucil, Prilosec 20 a day,  simvastatin 20 a day, Lutein, Toprol-XL 100 a day, and fish oil.   PHYSICAL EXAMINATION:  GENERAL:  He is well appearing, in no acute  distress, ambulatory in the clinic without any respiratory difficulty.  VITAL SIGNS:  Blood pressure is 92/70, heart rate 64, and weight is 233.  HEENT:  Normal.  NECK:  Supple.  No JVD.  Carotids are 2+ bilaterally without bruits.  There is no lymphadenopathy or thyromegaly.  CARDIAC:  PMI is nondisplaced.  He has regular rate and rhythm.  No  murmurs, rubs, or gallops.  LUNGS:  Clear.  ABDOMEN:  Soft, nontender, and nondistended.  No hepatosplenomegaly.  No  bruits.  No masses.  Good bowel sounds.  EXTREMITIES:  Warm with no cyanosis or clubbing.  There is trace edema.  No rash.  NEURO:  Alert and oriented x3.  Cranial nerves II-XII are intact.  Moves  all 4 extremities without difficulty.  Affect is pleasant.   LABORATORY DATA:  EKG shows atrial pacing at a rate of 64 with  nonspecific T-wave flattening.   ASSESSMENT/PLAN:  1. Tachybrady syndrome.  He had a Medtronic representative interrogate      his device.  He does in fact have a mean heart rate that is between      100 and 115.  Occasionally, he does have some PACs, which cause him      to slow down, but then he picks right back up.  I am wondering      whether or not he may have an abnormal focus of tachycardia.  We      are going to go ahead and increase his Toprol 150 mg  a day and see      how he tolerates.  I have asked him to come back and see Dr. Graciela Husbands      in early November for further evaluation and recommendations.  2. Hyperlipidemia.  Lipids are well controlled.   DISPOSITION:  We will see him back in 6 months for routine followup.     Bevelyn Buckles. Bensimhon, MD  Electronically Signed    DRB/MedQ  DD: 06/01/2008  DT: 06/02/2008  Job #: [id number]

## 2011-01-09 NOTE — Assessment & Plan Note (Signed)
[name] OFFICE NOTE   NAME:[name] [name], [name] [name]                       MRN:          [id number]  DATE:04/15/2008                            DOB:          16-Apr-1932    INTERVAL HISTORY:  [name] is a very pleasant 77 year old male with a  history of tachybrady syndrome status post ablation of reentrant SVT by  Dr. Deno Lunger at [facility].  This was complicated by GI bleed due to what  sounds like a colonic AVMs, which were coiled by Interventional  Radiology.  He also has a history of polymyalgia rheumatica, metabolic  syndrome, glucose intolerance, and hyperlipidemia.  He returns today for  routine followup.   The last visit we saw him his monitor showed significant pauses and he  recently underwent pacemaker placement by Dr. Deno Lunger at [facility] about a  week and half ago, which was uncomplicated.  He is doing well.  He is  back to walking.  He denies any chest pain or shortness of breath.  There is no lower extremity edema, no orthopnea, or PND.   He notes that his heart rates have been staying in the 100-110 range  pretty regularly.  This has not caused him any problems.  He has not  felt to be significantly irregular.  He did stop his Niaspan recently as  he was not tolerating it.   CURRENT MEDICATIONS:  1. Multivitamin.  2. Metamucil.  3. Toprol-XL 25 a day.  4. Omeprazole 20 a day.  5. Simvastatin 20 a day.  6. Lutein.   PHYSICAL EXAMINATION:  GENERAL:  He is well appearing in no acute  distress.  He ambulates around the clinic without any respiratory  difficulty.  VITAL SIGNS:  Blood pressure is 96/70, heart rate is 110, and weight is  225.  HEENT:  Normal.  NECK:  Supple.  No JVD.  Carotids are 2+ bilaterally without bruits.  There is no lymphadenopathy or thyromegaly.  CARDIAC:  PMI is nondisplaced.  He is regular and tachycardic.  No  murmurs.  Pacemaker site looks good.  LUNGS:  Clear.  ABDOMEN:   Soft, nontender, and nondistended.  No hepatosplenomegaly.  No  bruits.  No masses.  Good bowel sounds.  EXTREMITIES:  Warm with no cyanosis, clubbing, or edema.  No rash.  NEURO:  Alert and oriented x3.  Cranial nerves II through XII are  intact.  Moves all four extremities without difficulty.  Affect is  pleasant.   EKG shows sinus tachycardia at a rate of 110 with a nonspecific  interventricular conduction delay.  There is left axis deviation with a  left anterior fascicular block.  T-wave inversions in III and aVF.   LABORATORY DATA:  TSH and T4 were normal while he was at [facility].  Creatinine was 0.8.  Hemoglobin was 14.4.   ASSESSMENT/PLAN:  1. Tachybrady syndrome.  He is status post pacemaker.  He does remain      mildly tachycardic, but this appears to be sinus tachycardia.  We      will go ahead and increase his Toprol  to 50 mg and see how he      tolerates that.  His blood pressure is a little bit on the low      side, so I told him to make sure he is well hydrated.  If he is      having problems with orthostasis, he can cut back on the Toprol.  2. Hyperlipidemia.  He is due for a repeat lipid check.  He is now off      his niacin.  I told him to consider taking 1-2 g a day of fish oil.   DISPOSITION:  We will see him back in 5 months for followup.  He will be  followed here in our Device Clinic for his Medtronic pacemaker.     Bevelyn Buckles. Bensimhon, MD  Electronically Signed    DRB/MedQ  DD: 04/15/2008  DT: 04/16/2008  Job #: [id number]

## 2011-01-09 NOTE — Letter (Signed)
Jan 19, 2009    Bevelyn Buckles. Bensimhon, MD  [address], [place] [address]   RE:  [name] [name], [name] [name]  MRN:  [id number]  /  DOB:  [birth year]   Dear Jesusita Oka:   Mr. Millirons comes in today for device followup for bradycardia.  He has  a history of recurrent arrhythmias.  As you know, having undergone slow  pathway modification by Dr. Deno Lunger down at [facility].  When we saw him  last fall, we identified a new atrial tachycardia that appeared to be  associated with intercurrent deterioration of LV function from the 60%  to the 40-45% range and he was referred back to [facility] for a second  ablation procedure.  This targeted a right and left-sided focus  apparently.  It was felt to have been successful.   As you noted, however, when you saw him in April, he had developed  atrial fibrillation.  Interrogation of his device today demonstrates  that this has gone back until right around Christmas time and he has  been at 100% of the time since then.  Over a 42% of heart rates are  faster than 100 beats per minute and 15% of them are faster than 120  beats per minute.  The patient has had no untoward symptoms associated  with this.   I should note also that the patient previously was on Toprol which was  discontinued at [facility] in the fall because it was felt that it was not  necessary for atrial tachycardia as it was going to be ablated.   Furthermore, you have had discussions with the patient and his wife  regarding anticoagulation.  He has a CHADS2 score probably of 0 at this  point and so aspirin was initiated.  Furthermore, he has a history of  bleeding in his bowels, related to AVMs and it was felt that he might be  high risk for Coumadin.   As noted, he has had no significant changes in his symptoms.   His other medications currently include aspirin 325, simvastatin, and  omeprazole.   PHYSICAL EXAMINATION:  VITAL SIGNS:  His blood pressure at the last  visit was elevated at 142/92 but  that was an anomaly.  Today, it was  normal.  NECK:  His neck veins were 6-7 cm.  BACK:  Without kyphosis.  LUNGS:  Clear.  HEART:  Sounds were irregular with a 2/6 murmur heard at the apex.  ABDOMEN:  Soft with active bowel sounds.  EXTREMITIES:  Femoral pulses were not examined.  Distal pulses were  trace.  There was trace peripheral edema.   Interrogation of his Medtronic pulse generator demonstrated the  aforementioned findings related to his rate.  His atrial impedance was  512 and ventricular impedance was 695, thresholds were 0.625 at 0.4 and  the V could not be assessed and the battery voltage 2.79 with an  estimated longevity of 7 years.  Fibrillation wave was 1.4, R-wave was  5.6.   IMPRESSION:  1. Atrial fibrillation, new onset, now persistent with a rapid      ventricular response.  2. Status post prior ablation x2.      a.     Atrioventricular nodal reentry.      b.     Atrial tachycardia x2.  3. Left ventricular dysfunction thought to be tachycardia mediated,      last assessed in November.  4. Thromboembolic risk factors notable for:      a.  Borderline age.      b.     Intermittent elevations of blood pressure.      c.     Question left ventricular function.  5. History of gastrointestinal bleeding related to arteriovenous      malformations.   DISCUSSION:  Dan, Mr. Senft has atrial fibrillation which is  persistent and I guess the decision has been made to leave him in it  given his anticoagulation issues.  I think that is reasonable given the  paucity of his symptoms.  However, I think it is essential that we  control his heart rate as you know, especially given our prior concern  about tachycardia-induced cardiomyopathy.  To that end, I have resumed  his Toprol at 50 mg a day.  We will recheck an echo today to see what  his LV function is and this may have some bearing on anticoagulation.   We will plan to see him again in 1 months' time to reassess  the adequacy  of our heart rate control with the intent than in 3 months or so to  reassess left ventricular function.   Thanks very much for allowing [place] to participate in his care.    Sincerely,      Duke Salvia, MD, [facility]  Electronically Signed    SCK/MedQ  DD: 01/19/2009  DT: 01/20/2009  Job #: [id number]   CC:    Bedelia Person, MD

## 2011-01-09 NOTE — Progress Notes (Signed)
[facility]                  [facility] OFFICE NOTE   NAME:[name] [name], [name] [name]                       MRN:          [id number]  DATE:03/07/2009                            DOB:          [birth year]    [name] is seen in followup for atrial fibrillation that is now  culminant following prior ablation attempts by Dr. Deno [name] at [facility].  The plan has been to try to control his rate.  At his last visit, we  began him on Toprol 50 mg a day.  Fortunately, this had little impact on  symptoms; specifically there has been no fatigue, shortness of breath,  or change in effort tolerance.   His other medications were unchanged.   PHYSICAL EXAMINATION:  VITAL SIGNS:  His blood pressure was 130/70.  His  pulse was 77.  His weight was 242.  GENERAL:  He was alert and oriented.  SKIN:  Warm and dry.  NECK:  His neck veins were flat.  LUNGS:  Clear.  HEART:  Sounds were irregular without murmurs or gallops.  ABDOMEN:  Soft.  EXTREMITIES:  No edema.   Interrogation of his pacemaker, however, demonstrated a relatively flat  heart rate excursion.  He still has approximately 30% of his beats  faster than 100 beats per minute.   IMPRESSION:  1. Atrial fibrillation with a rapid ventricular response.  2. Previous ablations.  3. Recently assessed left ventricular function, which was essentially      normal.  4. Previously implanted Medtronic pacemaker in the A-D mode.   [name] is doing pretty well and he has no symptoms.  The concern  that I have is whether his rapid rate will have an impact on his LV  function.  We will plan to reassess this in 3 months' time.  If there is  persistent rapid rates despite the up titration of his Toprol which we  will accomplish today by changing from 50-100 mg, we will plan to get an  echo prior to him seeing Dr. Gala [name], so as to make a decision as to  whether anything further needs to be done.     [name] Salvia, MD, [facility]  Electronically Signed    SCK/MedQ  DD: 03/07/2009  DT: 03/08/2009  Job #: [id number]

## 2011-01-09 NOTE — Assessment & Plan Note (Signed)
[name] OFFICE NOTE   NAME:[name] [name], [name] [name]                       MRN:          [id number]  DATE:06/04/2007                            DOB:          12/19/1931    INTERVAL HISTORY:  Mr. Campusano is a delightful 77 year old male with a  history of sick sinus syndrome, with ST and bradycardia.  He also has a  history of metabolic syndrome and glucose intolerance and  hyperlipidemia.  He returns today for routine followup.   Since we last saw him, he has had quite a difficult time.  Several weeks  ago he had some lip numbness, concerning for a TIA, then an MRI of the  brain which was okay.  There was some mild plaquing in his left carotid,  but this was not flow-limiting.  He also developed proximal muscle  soreness and weakness and he was diagnosed with polymyalgia rheumatica  and has been started on steroids with a good effect.  Last week he had  an episode of severe palpitations and chest pressure.  [facility] and was found to be in an SVT with a heart rate of about 190.  He  was treated with adenosine and this did not initially break the rhythm,  but he says that about five to seven minutes later, he converted to a  normal rhythm.  He has not had an episode since, but has noted that his  heart rate has been somewhat faster. He continues to walk two miles a  day.  We did put a Holter monitor on him back in May.  This showed a  sinus bradycardia with a mean rate of 60.  He had frequent PACs and  occasional brief runs of SVT.  His minimum heart rate was 37 beats per  minute with multiple periods of bradycardia.  We did discuss a possible  pacemaker at that time, as we have done previously, but as he was not  overly symptomatically, we did not proceed.   He denies any significant lower extremity edema.  He has not had any  orthopnea or PND.   CURRENT MEDICATIONS:  1. Niaspan 1000 mg daily.  2.  Aspirin 81 mg.  3. Selenium.  4. Lutein.  5. Multivitamin.  6. Metamucil two daily.  7. Toprol XL 25 mg daily.  8. Simvastatin 20 mg, on hold.  9. Lisinopril 20 mg daily.  10.Prednisone 15 mg daily.  11.Prilosec 20 mg daily.   PHYSICAL EXAMINATION:  GENERAL:  He is well-appearing, in no acute  distress.  He ambulates around the clinic without any respiratory  difficulty.  VITAL SIGNS:  Blood pressure 96/70, heart rate 111, weight 234 pounds.  HEENT:  Normal.  NECK:  Supple.  There is no jugular venous distention.  Carotids are 2+  bilaterally, without bruits.  There is no lymphadenopathy or  thyromegaly.  HEART:  PMI is non-displaced.  He is tachycardic and regular.  A soft  systolic ejection murmur at the right sternal border.  There is no  gallop.  LUNGS:  Clear.  ABDOMEN:  Soft, nontender, non-distended.  No hepatosplenomegaly, no  bruits.  No masses.  Good bowel sounds.  EXTREMITIES:  Warm with no clubbing, cyanosis or edema.  SKIN:  No rash.  NEUROLOGIC:  He is alert and oriented x3.  Cranial nerves II-XII  are  intact.  He moves all four extremities without difficulty.  Affect is  pleasant.   Electrocardiogram shows sinus tachycardia at a rate of 111 with no  significant ST-T wave abnormalities.   ASSESSMENT/PLAN:  1. Tachycardia/bradycardia syndrome:  It appears that he has had a      fairly significant episode of supraventricular tachycardia.  I      suspect this may be atrioventricular node reaction tachycardia.  He      is scheduled to see Dr. Clinton Sawyer at [facility] on Monday, and hopefully      then follow up with Dr. Callie Fielding in EP.  The question is      whether he will need a pacemaker, just to permit more beta blocker,      or would he be a candidate for catheter ablation?  I am a bit      puzzled as to why he is so tachycardic today.  He is usually quite      bradycardic.  He had some baseline labs at the [facility],      which he says were normal.   It does not appear that they checked      his thyroid.  Will go ahead and do that.  I think it would also be      reasonable to get an echocardiogram, but he said that he would      probably be able to get  this at [facility].  2. Chest pressure:  This was only in the setting of rapid      supraventricular tachycardia; however, it has been several years      since his most recent stress test, and I do think it would be      reasonable to proceed with an adenosine Myoview.  3. Hyperlipidemia:  He has stopped his Zocor, due to myalgias, but I      think this is more likely due to his polymyalgia rheumatica and I      have asked him to go back on his simvastatin.   DISPOSITION:  He will follow up with [facility] on Monday.  I have asked him  to also increase his Toprol to 25 mg twice daily, his blood pressure  tolerating.  We will see him back in several months for routine  followup.     Bevelyn Buckles. Bensimhon, MD  Electronically Signed    DRB/MedQ  DD: 06/04/2007  DT: 06/04/2007  Job #: [id number]   cc:   Clinton Sawyer, M.D.  Callie Fielding, M.D.

## 2011-01-09 NOTE — Assessment & Plan Note (Signed)
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  He also has a history of polymyalgia rheumatic, metabolic  syndrome, glucose intolerance and hyperlipidemia.  He returns today for  routine follow-up.   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  He was sent to [facility] and underwent  ablation by [name] [name].  Post ablation he did well but then  experienced severe lower GI bleed due to what sounds like a colonic AVM.  He underwent coiling by Interventional Radiology, apparently he had 7  units of blood transfused.  Since that time, he is doing well.  He is  very active, walking half an hour to an hour a day with no chest pain or  shortness of breath.  He denies palpitations.  However, he does note  that his heart rate is almost always in the 100-115 range.   CURRENT MEDICATIONS:  1. Niaspan 500 a day.  2. Lutein.  3. Metamucil.  4. Prednisone 6 mg a day.  5. Omeprazole 20 a day.  6. Niaspan 500 a day.  7. Simvastatin 20 a day.  8. He is not on beta blockers due to intermittent bradycardia.   PHYSICAL EXAMINATION:  GENERAL:  He is well-appearing, no acute  distress.  Ambulates around the clinic without any respiratory  difficulty.  VITAL SIGNS:  Blood pressure is 110/78, heart rate 116, weight 230.  HEENT:  Normal.  NECK:  Supple.  No JVD, carotid 2+ bilaterally without bruits.  No  lymphadenopathy or thyromegaly.  CARDIAC:  PMI is nondisplaced.  He is regular and tachycardic.  No  murmurs.  LUNGS:  Clear.  ABDOMEN:  Soft, nontender, nondistended.  No hepatosplenomegaly.   No  bruits, no masses.  Good bowel sounds.  EXTREMITIES:  Warm, no cyanosis, clubbing or edema.  No rash.  NEUROLOGICAL:  Alert and oriented x3.  Cranial nerves II-XII are intact.  Moves all four extremities without difficulty.  Affect is very pleasant.   STUDIES:  EKG shows sinus pack of 116, no ST-T wave abnormalities.   ASSESSMENT/PLAN:  1. Tachycardia.  I am not sure of the etiology of this.  He was also      tachycardia in the office the last time we saw him.  I suspect this      is just physiologic.  However, I do worry about precipitating      tachycardiac cardiomyopathy if this persists.  We will check a CBC      to make sure he is not anemic, TSH and echocardiogram to rule out      any possible effusion.  We will also put on a Holter monitor to see  what his mean heart rate is.  Unfortunately, we are unable to beta      block him due to his tachybrady syndrome and periods of significant      bradycardia.  If he is persistently tachycardiac, without any      easily reversible cause, I will refer him back to [name] [name].      At some point, may have to discuss the possibility of pacemaker to      prevent bradycardia while treating his tachycardia.  2. History of supraventricular tachycardia status post ablation.  3. Hyperlipidemia as followed by his primary care physicians.  His      lipids have been well controlled.  4. Hypertension.  This is under control.   DISPOSITION:  Will see him back in 4 months.  We will get back in touch  with him by phone to discuss the results of his testing.     [name] Buckles. Bensimhon, MD  Electronically Signed    DRB/MedQ  DD: 12/29/2007  DT: 12/29/2007  Job #: [id number]   cc:   [facility] [name] [name]  [facility] [name] [name]

## 2011-01-09 NOTE — Letter (Signed)
June 28, 2008    Bevelyn Buckles. Bensimhon, MD  [address], [place] [address]   RE:  [name] [name], [name] [name]  MRN:  [id number]  /  DOB:  01/06/1932   Dear Jesusita Oka,   It was a pleasure to meet Mr. Briggs today at your request.  He saw Dr.  Carron Curie down at [facility] yesterday.  As you know, he had a pacemaker  implanted in August 2009 because of sinus node dysfunction with heart  rates going from 30s and 40s up to the 150s.  In the wake of that, it is  apparently Dr. Carron Curie is feeling that he has an ectopic atrial focus.  He does not however have any attributable symptoms and so the  recommendation was to just live it alone.   He has undergone ablation for SVT in the past.  He had reentrant  tachycardia about a year ago, and Dr. Carron Curie ablated that without  event.   The patient's post discharge course; however, was complicated by major  GI bleed, which was finally successfully terminated with coil therapy  done by the Interventional Radiology Team at [facility].   The patient specifically has no complaints of chest pain, shortness of  breath, or peripheral edema.   MEDICATIONS:  Currently include Toprol, which has been down titrated  from 100-50 and with a targeted goal of 25.  I should note at this  juncture, that Dr. Carron Curie did not feel that Toprol had helped the  patient's rhythm.  His other medications include omeprazole,  simvastatin, and, lutein as well as fish oil.   He has no known drug allergies.   PHYSICAL EXAMINATION:  GENERAL:  He is a healthy-appearing Caucasian  male who is a little bit anxious about his rhythm issue.  VITAL SIGNS:  His weight is 233 which is stable over the last month, his  blood pressure is 112/72, his pulse is 110, it went to 65 transiently  and then has stayed up at the 110 range since.  HEENT:  No icterus or xanthoma.  NECK:  Veins were flat.  The carotids were brisk and full bilaterally  without bruits.  BACK:  Without kyphosis or  scoliosis.  LUNGS:  Clear.  HEART:  Sounds were regular, but rapid.  ABDOMEN:  Soft, but protuberant.  EXTREMITIES:  No edema.   Interrogation of his Medtronic adapter pulse generator demonstrates P-  wave of greater than 5.6 with an R-wave of 11, the atrial impedance was  536 with threshold of 1 volt at 0.4, the RV impedance was 695 with a  threshold 0.75 at 0.4.  Battery voltage was 2.79.  He is 43%  ventricularly paced and 4.7% atrial paced, the likelihood of which is  that when he is in sinus rhythm.  Heart rate histograms are notable that  most of his beats are greater than 100 beats per minute.   IMPRESSION:  1. Atrial tachycardia.  2. Sinus node dysfunction status post Medtronic adapta pulse generator      implantation.  3. Reentrant supraventricular tachycardia.  4. Anxiety related to atrial tachycardia.   Jesusita Oka, Mr. Coburn not having any attributable symptoms his atrial  tachycardia.  The reason to do something then would be if there is an  impact on his left ventricular function, so I have ordered an echo which  he is to get I think before he sees you next week.  In the event  that  he has evidence of left ventricular dysfunction.  I think it becomes  incumbent to direct therapy at his tachycardia.  He has very close  relation with Dr. Berneice Gandy, and I think it would at that point be worth  either you or me calling him to pass on that information.  I think when  Dennie Bible last saw him the discussion of catheter ablation had come up in the  event that symptoms progressed.   Thanks very much for asking [place] to see him.  I will be glad to follow  along whatever way you think is most helpful.    Sincerely,     [facility] Salvia, MD, [facility]  Electronically Signed   SCK/MedQ  DD: 06/28/2008  DT: 06/29/2008  Job #: [id number]

## 2011-01-09 NOTE — Assessment & Plan Note (Signed)
[name] OFFICE NOTE   NAME:Doolan, DWIGHT                       MRN:          [id number]  DATE:07/08/2008                            DOB:          Feb 24, 1932    INTERVAL HISTORY:  [name] is a very pleasant 77 year old male with a  history of tachy-brady syndrome status post ablation of a reentrant SVT  by Dr. Deno Lunger at [place].  He is also status post pacemaker placement.  Remainder of his history is notable for a previous GI bleed likely due  to colonic AVMs, which were being coiled by Interventional Radiology,  polymyalgia rheumatica, metabolic syndrome, glucose intolerance, and  hyperlipidemia.   He recently has been struggling with persistent tachycardia with heart  rates between a 100 and a 115.  He was seen at the [facility], and  it was thought that this was likely an high atrial tachycardia very near  to sinus.  His Toprol dose was decreased, and it was thought that he  might benefit from flecainide if it persisted.   From a functional point of view, he continues to walk every day without  any chest pain or dyspnea.  He is quite troubled by his tachycardia and  worried it might hurt his heart.  We got an echocardiogram, which showed  ejection fraction of 45-50% and mild RV dysfunction.  There was also  very mild aortic stenosis with a mean transaortic gradient of 5.1.   CURRENT MEDICATIONS:  1. Multivitamin.  2. Metamucil.  3. Prilosec 20 a day.  4. Simvastatin 20 a day.  5. Lutein  6. Fish oil 1 g a day.  7. Toprol 50 a day.   PHYSICAL EXAMINATION:  GENERAL:  He is well appearing, in no acute  distress, ambulatory around the clinic, without any respiratory  difficulty.  VITAL SIGNS:  Blood pressure is 106/70, heart rate is 94, weight is 236.  HEENT:  Normal.  NECK:  Supple.  No JVD.  Carotids are 2+ bilaterally without any bruits.  There is no lymphadenopathy or thyromegaly.  CARDIAC:   PMI is nondisplaced.  It is regular.  Soft systolic ejection  murmur at the right sternal border.  No rub.  LUNGS:  Clear.  ABDOMEN:  Soft, nontender, nondistended.  No hepatosplenomegaly.  No  bruits.  No masses.  Good bowel sounds.  EXTREMITIES:  Warm with no cyanosis, clubbing, or edema.  No rash.  NEURO:  Alert and oriented x3.  Cranial nerves II through XII are  intact.  Moves all 4 extremities without difficulty.  Affect is  pleasant.   EKG shows sinus rhythm with nonspecific T-wave abnormality at a rate of  94.   ASSESSMENT AND PLAN:  1. Tachycardia.  This has been evaluated by EP.  It appears that this      may explain an atrial tachycardia that he is in and out of;      however, it now appears that he may have a mild tachycardic-induced      cardiomyopathy.  Thus, I think we need to address this  more      definitively.  I had spoken with Dr. Deno Lunger at [facility],      and we will go ahead and set him up for evaluation there, possible      ablation.  2. Hyperlipidemia.  Continue statin.  Goal LDL is less than 70.   DISPOSITION:  We will see him back in several months after he follows up  with EP.     Bevelyn Buckles. Bensimhon, MD  Electronically Signed    DRB/MedQ  DD: 07/08/2008  DT: 07/09/2008  Job #: [id number]

## 2011-01-12 ENCOUNTER — Other Ambulatory Visit: Payer: Self-pay | Admitting: Internal Medicine

## 2011-01-12 NOTE — Assessment & Plan Note (Signed)
[facility]                              CARDIOLOGY OFFICE NOTE   NAME:Rabelo, DACE DENN              MRN:          [id number]  DATE:04/24/2006                            DOB:          11/23/1931    PATIENT IDENTIFICATION:  Mr. Cancro is a delightful 77 year old male who  returns for a routine followup.   PROBLEM:  1. History of supraventricular tachycardia.      a.     Previously evaluated by Dr. Jacqualin Combes, has been well-controlled       on beta blockers.      b.     A 48-hour Holter monitor in June of 2007 showed a sinus rhythm       with some early morning bradycardia with heart rates in the 30s and an       occasional 2-second pause.  There are multiple short runs of SVT but       no prolonged episodes.  Mean heart rate was 65 beats per minute       overall.  2. Hypertension/hyperlipidemia, consistent with metabolic syndrome,      currently on therapy.  3. Glucose intolerance.  4. Obesity, now with significant weight loss.  5. History of diverticulosis.   CURRENT MEDICATIONS:  1. AcipHex 20 mg a day.  2. Altace 5 a day.  3. Niaspan 1000 a day.  4. Aspirin 81.  5. Selenium.  6. Lutein.  7. Multivitamin.  8. Metamucil.  9. Toprol XL 25 a day.  10.Simvastatin 20 mg q.h.s.   INTERVAL HISTORY:  Mr. Stripling returns today for routine followup with his  wife.  He continues to do quite well.  He is walking two miles a day without  any chest pain or shortness of breath.  He has also been checking his blood  pressure regularly with his automated cuff which we have evaluated here  today and seems to be reasonably close to the office pressures.  He denies  any palpitations.  He has not had any syncope or pre-syncope.   PHYSICAL EXAMINATION:  GENERAL:  He is well-appearing, no acute distress,  ambulates around the clinic vigorously without any difficulty.  VITAL SIGNS:  Blood pressure is 110/65 with a heart rate of 64.  His weight  is  234 pounds.  HEENT:  Sclerae anicteric, EOMI.  There is no xanthelasma.  Mucous membranes  are moist.  NECK:  Supple.  There is no JVD.  Carotids are 2+ bilaterally without  bruits.  There is no lymphadenopathy or thyromegaly.  CARDIAC:  He is regular with no murmurs, rubs or gallops.  LUNGS:  Clear.  ABDOMEN:  Soft, nontender, nondistended.  No hepatosplenomegaly, no bruits,  no masses, good bowel sounds.  EXTREMITIES:  Warm with no cyanosis, clubbing or edema.  There is no  cyanosis or clubbing.  There is trace edema bilaterally.  Distal pulses are  strong.  NEUROLOGIC:  He is alert and oriented x3.  Cranial nerves 2-12 are intact.  He moves all four extremities without difficulty.   ASSESSMENT/PLAN:  1. Supraventricular tachycardia.  This is quite stable.  We will continue  his Toprol at 25 mg a day.  We will not titrate it up, as he has had      significant bradycardia at higher doses.  2. Hypertension, well controlled.  3. Hyperlipidemia.  He is due to have his cholesterol checked.  We will      also check a liver panel, continue current regimen for now.  Given our      aggressive efforts at risk reduction, I would like to see his LDL close      to 70 if not below.  4. Lower extremity edema.  This is mild.  I suspect it is probably due to      mild venous insufficiency.  In fact, only if it gets worse we can add a      diuretic.  5. Glucose intolerance.  We will recheck a hemoglobin A1c.  I did suggest      that he continue to try to lose weight with diet and exercise to get      his weight down to about 215 pounds.   DISPOSITION:  We will see him back in 6 months for routine followup.  We  will be getting yearly 48-hour Holter monitors to keep track of his SVT, as  this can be quite asymptomatic for him.                                Bevelyn Buckles. Bensimhon, MD    DRB/MedQ  DD:  04/24/2006  DT:  04/25/2006  Job #:  [id number]   cc:   Limmie Patricia @ [facility]

## 2011-01-12 NOTE — Assessment & Plan Note (Signed)
[facility] OFFICE NOTE   NAME:[name] [name], [name] [name]              MRN:          [id number]  DATE:11/12/2006                            DOB:          Apr 11, 1932    INTERVAL HISTORY:  [name] [name] is a delightful 77 year old male with a  history of sick sinus syndrome with SVT and bradycardia.  He also has a  history of metabolic syndrome and glucose intolerance.  He returns today  for routine followup.  He has been quite busy traveling back and forth  to [place] to take care of his in-laws.   He says he is doing well.  He continues to walk about 2 miles a day  without any chest pain or shortness of breath.  He has not had any  syncope or presyncope and no palpitations.   CURRENT MEDICATIONS:  1. Aciphex 20 mg a day.  2. Niacin 1000 a day.  3. Aspirin 81.  4. Selenium, lutein, multivitamin.  5. Toprol XL 25 a day.  6. Simvastatin 20 a day.  7. Lisinopril 20 a day.   PHYSICAL EXAMINATION:  He is well appearing, in no acute distress.  He  ambulates around the clinic without respiratory difficulty.  Blood  pressure is 106/70, heart rate is 47. Weight is 242.  HEENT:  Sclerae anicteric.  EOMI.  There is no xanthelasmas.  Mucous  membranes are moist.  NECK:  Supple, no JVD.  Carotids are 2+ bilaterally without bruits.  There is no lymphadenopathy or thyromegaly.  CARDIAC:  He is bradycardic and mildly irregular, with a soft systolic  murmur at the right sternal border.  There is no gallop.  LUNGS:  Clear.  ABDOMEN:  Soft, nontender, nondistended.  No hepatosplenomegaly, no  bruits, no masses.  Good bowel sounds.  EXTREMITIES:  Warm with no cyanosis, clubbing or edema, no rash.  NEURO:  He is alert and oriented x3.  Cranial nerves II-XII intact,  moves all 4 extremities without difficulty.   EKG shows sinus bradycardia at a rate of 47 with occasional PACs.  There  is incomplete right bundle branch block.   ASSESSMENT AND PLAN:  1. Sick sinus syndrome with history of SVT.  I told him that at some      point he would probably likely need a pacemaker.  We also did      discuss the possibility of switching his Toprol to Pindolol.  I      have suggested repeating his 48-hour Holter monitor, however he      would like to wait 2 months until things calm down otherwise.  I      told him this is okay as long as he is not symptomatic.  2. Hyperlipidemia/metabolic syndrome.  I will recheck his lipids      today.  I have encouraged him to keep consistent with his exercise      program.   DISPOSITION:  Return to clinic in 6 months.     Bevelyn Buckles. Bensimhon, MD  Electronically Signed    DRB/MedQ  DD: 11/12/2006  DT: 11/12/2006  Job #: [id number]

## 2011-01-12 NOTE — Telephone Encounter (Signed)
Does Dr Gala Romney want the pt to continue these medications?  If so, please call into [facility].

## 2011-01-12 NOTE — Telephone Encounter (Signed)
Does patient need to continue on Lasix and potassium?

## 2011-01-12 NOTE — Telephone Encounter (Signed)
Does patient need to continue on the following medications?

## 2011-01-15 ENCOUNTER — Other Ambulatory Visit: Payer: Self-pay

## 2011-01-15 MED ORDER — FUROSEMIDE 40 MG PO TABS: ORAL_TABLET | ORAL | Status: DC

## 2011-01-15 MED ORDER — OMEPRAZOLE 20 MG PO CPDR: 20.0000 mg | DELAYED_RELEASE_CAPSULE | Freq: Every day | ORAL | Status: DC

## 2011-01-15 MED ORDER — POTASSIUM CHLORIDE CRYS ER 20 MEQ PO TBCR: EXTENDED_RELEASE_TABLET | ORAL | Status: DC

## 2011-01-15 NOTE — Telephone Encounter (Signed)
Needs a refill for omeprazole 20 mg take one tablet daily 90 day supply with 3 refills to [facility]

## 2011-01-15 NOTE — Telephone Encounter (Signed)
Refill sent for lasix and potassium to [facility].

## 2011-02-02 ENCOUNTER — Encounter: Payer: Self-pay | Admitting: *Deleted

## 2011-02-27 ENCOUNTER — Telehealth: Payer: Self-pay | Admitting: Internal Medicine

## 2011-02-27 NOTE — Telephone Encounter (Signed)
Pt last seen 09/2010. Pt takes ASA 325mg , has chronic a fib, rate controlled, not on coumadin, only ASA. (not coumadin candidate). Can pt hold prior to eyelid surgery? Please advise.

## 2011-02-27 NOTE — Telephone Encounter (Signed)
Pt needs to come off of Aspirin 7-10 days before eyelid surgery on 7/27.  Please advise.

## 2011-02-28 NOTE — Telephone Encounter (Signed)
Ok to hold ASA for surgery if they require it.

## 2011-03-01 NOTE — Telephone Encounter (Signed)
Pt notified of msg below, he states that they do not need clearance letter sent.

## 2011-03-21 ENCOUNTER — Ambulatory Visit (INDEPENDENT_AMBULATORY_CARE_PROVIDER_SITE_OTHER): Payer: Medicare Other | Admitting: *Deleted

## 2011-03-21 DIAGNOSIS — I495 Sick sinus syndrome: Secondary | ICD-10-CM

## 2011-03-21 NOTE — Progress Notes (Signed)
Pacer check/ 

## 2011-03-30 ENCOUNTER — Ambulatory Visit: Payer: Medicare Other | Admitting: Cardiovascular Disease

## 2011-04-27 ENCOUNTER — Encounter: Payer: Self-pay | Admitting: Cardiovascular Disease

## 2011-05-01 ENCOUNTER — Telehealth: Payer: Self-pay

## 2011-05-01 ENCOUNTER — Encounter: Payer: Self-pay | Admitting: Cardiovascular Disease

## 2011-05-01 ENCOUNTER — Ambulatory Visit (INDEPENDENT_AMBULATORY_CARE_PROVIDER_SITE_OTHER): Payer: Medicare Other | Admitting: Cardiovascular Disease

## 2011-05-01 DIAGNOSIS — I4891 Unspecified atrial fibrillation: Secondary | ICD-10-CM

## 2011-05-01 DIAGNOSIS — E785 Hyperlipidemia, unspecified: Secondary | ICD-10-CM

## 2011-05-01 MED ORDER — SIMVASTATIN 20 MG PO TABS: 20.0000 mg | ORAL_TABLET | Freq: Every day | ORAL | Status: DC

## 2011-05-01 MED ORDER — METOPROLOL SUCCINATE ER 100 MG PO TB24: 150.0000 mg | ORAL_TABLET | Freq: Every day | ORAL | Status: DC

## 2011-05-01 NOTE — Telephone Encounter (Signed)
Duplicate refill sent at office visit with dr. Mariah Milling.

## 2011-05-01 NOTE — Progress Notes (Signed)
Patient ID: [name] [name], male    DOB: 12-Feb-1932, 77 y.o.   MRN: [id number]  HPI Comments: [name] [name] is a very pleasant 77 year old male with a history of tachy-brady syndrome status post ablation of a reentrant SVT by Dr. Deno Lunger at [facility]. Followed by placement of permanent pacemaker.  December 2009 had a f/u ablation of a left-sided and right-sided ectopic atrial trachycardia. He now has permanent atrial fib. previous GI bleed x3 likely due to colonic AVMs, which were coiled by Interventional Radiology,  Also with polymyalgia rheumatica, metabolic syndrome, glucose intolerance on prednisone, andhyperlipidemia.     He returns for routine f/u. Doing well. Walking regularly with no CP or SOB. Does have mild edema. No palpitations, syncope or presyncope.   Feels well walking every day. No CP or shortness of breath. + LE edema and takes lasix 3x/week. No problems with ASA.    Had echo last year with EF 45-50% with global HK. RV normal. mild to moderate TR with PAH  ~57mmHG. Had f/u nuclear study in November 2011. Showed EF 51% with thinning of apex and inferior wall.  low risk scan.     EKG shows atrial fibrillation with ventricular paced rhythm, rate 67 beats per minute    Outpatient Encounter Prescriptions as of 05/01/2011  Medication Sig Dispense Refill  . aspirin 325 MG tablet Take 325 mg by mouth daily.        . fish oil-omega-3 fatty acids 1000 MG capsule Take 2 g by mouth daily.        . furosemide (LASIX) 40 MG tablet Take one tablet on Monday, Wednesday and Friday.  45 tablet  3  . Lutein 6 MG CAPS Take 1 capsule by mouth daily.        . metoprolol (TOPROL-XL) 100 MG 24 hr tablet Take 150 mg by mouth daily.        . Multiple Vitamins-Minerals (CENTRUM SILVER PO) Take by mouth daily.        Marland Kitchen omeprazole (PRILOSEC) 20 MG capsule Take 1 capsule (20 mg total) by mouth daily.  90 capsule  3  . potassium chloride SA (K-DUR,KLOR-CON) 20 MEQ tablet Take one tablet on Mondays, Wednesday  and Fridays.  45 tablet  3  . Psyllium-Calcium (METAMUCIL PLUS CALCIUM) CAPS Take 2 capsules by mouth daily.        . simvastatin (ZOCOR) 20 MG tablet Take 20 mg by mouth at bedtime.           Review of Systems  Constitutional: Negative.   HENT: Negative.   Eyes: Negative.   Respiratory: Negative.   Cardiovascular: Negative.   Gastrointestinal: Negative.   Musculoskeletal: Negative.   Skin: Negative.   Neurological: Negative.   Hematological: Negative.   Psychiatric/Behavioral: Negative.   All other systems reviewed and are negative.    ,BP 134/75  Pulse 76  Ht 6\' 2"  (1.88 m)  Wt 253 lb (114.76 kg)  BMI 32.48 kg/m2   Physical Exam  Nursing note and vitals reviewed. Constitutional: He is oriented to person, place, and time. He appears well-developed and well-nourished.  HENT:  Head: Normocephalic.  Nose: Nose normal.  Mouth/Throat: Oropharynx is clear and moist.  Eyes: Conjunctivae are normal. Pupils are equal, round, and reactive to light.  Neck: Normal range of motion. Neck supple. No JVD present.  Cardiovascular: Normal rate, S1 normal, S2 normal, normal heart sounds and intact distal pulses.  An irregularly irregular rhythm present. Exam reveals no gallop and no friction rub.  No murmur heard.      Trace edema above the sock line  Pulmonary/Chest: Effort normal and breath sounds normal. No respiratory distress. He has no wheezes. He has no rales. He exhibits no tenderness.  Abdominal: Soft. Bowel sounds are normal. He exhibits no distension. There is no tenderness.  Musculoskeletal: Normal range of motion. He exhibits no edema and no tenderness.  Lymphadenopathy:    He has no cervical adenopathy.  Neurological: He is alert and oriented to person, place, and time. Coordination normal.  Skin: Skin is warm and dry. No rash noted. No erythema.  Psychiatric: He has a normal mood and affect. His behavior is normal. Judgment and thought content normal.            Assessment and Plan

## 2011-05-01 NOTE — Assessment & Plan Note (Signed)
Cholesterol is at goal on the current lipid regimen. No changes to the medications were made.  

## 2011-05-01 NOTE — Patient Instructions (Addendum)
You are doing well. No medication changes were made. Please call us if you have new issues that need to be addressed before your next appt.  We will call you for a follow up Appt. In 6 months  

## 2011-05-01 NOTE — Assessment & Plan Note (Signed)
Permanent atrial fibrillation. No significant symptoms. Adequate rate control on metoprolol.

## 2011-08-14 ENCOUNTER — Telehealth: Payer: Self-pay | Admitting: Internal Medicine

## 2011-08-14 NOTE — Telephone Encounter (Signed)
All cardiac faxed to [facility] @ [phone] 08/14/11/km

## 2011-10-12 ENCOUNTER — Telehealth: Payer: Self-pay | Admitting: Internal Medicine

## 2011-10-12 NOTE — Telephone Encounter (Signed)
10-12-11 per pt he is having his device checked at [facility]/mt

## 2011-10-26 ENCOUNTER — Encounter: Payer: Self-pay | Admitting: *Deleted

## 2011-10-30 ENCOUNTER — Telehealth: Payer: Self-pay | Admitting: Internal Medicine

## 2011-10-30 NOTE — Telephone Encounter (Signed)
New Msg: Pt calling wanting to inform office that pt care is now transferred to [facility] ([place]). Pt will no longer be under the care of [facility]. Please return pt call to discuss further if necessary.

## 2011-10-31 NOTE — Telephone Encounter (Signed)
Patient now inactive in [facility] and will follow @ [facility].

## 2012-01-21 ENCOUNTER — Telehealth: Payer: Self-pay | Admitting: Internal Medicine

## 2012-01-21 NOTE — Telephone Encounter (Signed)
01-21-12 pt having device checked at [facility]/mt

## 2012-06-09 ENCOUNTER — Other Ambulatory Visit: Payer: Self-pay | Admitting: Cardiovascular Disease

## 2012-06-09 NOTE — Telephone Encounter (Signed)
LMTCB with pt wife he is aware that he is overdue for 6 month f/u has not been seen over a year and needs to schedule a future appointment. Refilled Simvastatin until able to be seen.

## 2012-06-17 ENCOUNTER — Telehealth: Payer: Self-pay

## 2012-06-17 ENCOUNTER — Telehealth: Payer: Self-pay | Admitting: Cardiovascular Disease

## 2012-06-17 NOTE — Telephone Encounter (Signed)
Spoke to [place] with [facility] to cancel any other refill/order for the Simvastatin since the patient states is no longer under our care and the patient does not get refills with the [facility] in [place], he gets his refills from the [employer] order.

## 2012-06-17 NOTE — Telephone Encounter (Signed)
Patient came by office and is no longer being followed by [facility], he transferred his care to [facility] after Dr. Gala Romney was no longer coming to the [place] office.  Please do not refill any medications per patient request.

## 2012-07-04 ENCOUNTER — Other Ambulatory Visit: Payer: Self-pay | Admitting: Cardiovascular Disease

## 2014-01-09 ENCOUNTER — Ambulatory Visit: Payer: Self-pay | Admitting: Family Medicine

## 2014-05-22 ENCOUNTER — Ambulatory Visit: Payer: Self-pay | Admitting: Family Medicine

## 2014-08-30 ENCOUNTER — Ambulatory Visit: Payer: Self-pay

## 2016-05-15 ENCOUNTER — Other Ambulatory Visit
Admission: RE | Admit: 2016-05-15 | Discharge: 2016-05-15 | Disposition: A | Discharge status: Home / Self Care | Payer: Medicare Other | Source: Ambulatory Visit | Attending: Gastroenterology | Admitting: Gastroenterology

## 2016-05-15 DIAGNOSIS — R197 Diarrhea, unspecified: Secondary | ICD-10-CM | POA: Diagnosis present

## 2016-05-15 LAB — C DIFFICILE QUICK SCREEN W PCR REFLEX
C Diff antigen: POSITIVE — AB
C Diff toxin: NEGATIVE

## 2016-05-15 LAB — GASTROINTESTINAL PANEL BY PCR, STOOL (REPLACES STOOL CULTURE)
ADENOVIRUS F40/41: NOT DETECTED
ASTROVIRUS: NOT DETECTED
CAMPYLOBACTER SPECIES: NOT DETECTED
CYCLOSPORA CAYETANENSIS: DETECTED — AB
Cryptosporidium: NOT DETECTED
E. COLI O157: NOT DETECTED
ENTAMOEBA HISTOLYTICA: NOT DETECTED
ENTEROPATHOGENIC E COLI (EPEC): NOT DETECTED
ENTEROTOXIGENIC E COLI (ETEC): NOT DETECTED
Enteroaggregative E coli (EAEC): NOT DETECTED
Giardia lamblia: NOT DETECTED
Norovirus GI/GII: NOT DETECTED
Plesimonas shigelloides: NOT DETECTED
ROTAVIRUS A: NOT DETECTED
SHIGELLA/ENTEROINVASIVE E COLI (EIEC): NOT DETECTED
Salmonella species: NOT DETECTED
Sapovirus (I, II, IV, and V): NOT DETECTED
Shiga like toxin producing E coli (STEC): NOT DETECTED
VIBRIO CHOLERAE: NOT DETECTED
VIBRIO SPECIES: NOT DETECTED
Yersinia enterocolitica: NOT DETECTED

## 2016-05-15 LAB — CLOSTRIDIUM DIFFICILE BY PCR: Toxigenic C. Difficile by PCR: POSITIVE — AB

## 2016-06-15 ENCOUNTER — Encounter: Payer: Self-pay | Admitting: *Deleted

## 2016-06-18 ENCOUNTER — Ambulatory Visit: Payer: Medicare Other | Admitting: Anesthesiology

## 2016-06-18 ENCOUNTER — Encounter: Payer: Self-pay | Admitting: *Deleted

## 2016-06-18 ENCOUNTER — Encounter
Admission: RE | Disposition: A | Discharge status: Home / Self Care | Payer: Self-pay | Source: Ambulatory Visit | Attending: Unknown Physician Specialty

## 2016-06-18 ENCOUNTER — Ambulatory Visit
Admission: RE | Admit: 2016-06-18 | Discharge: 2016-06-18 | Disposition: A | Discharge status: Home / Self Care | Payer: Medicare Other | Source: Ambulatory Visit | Attending: Unknown Physician Specialty | Admitting: Unknown Physician Specialty

## 2016-06-18 DIAGNOSIS — I10 Essential (primary) hypertension: Secondary | ICD-10-CM | POA: Insufficient documentation

## 2016-06-18 DIAGNOSIS — E785 Hyperlipidemia, unspecified: Secondary | ICD-10-CM | POA: Diagnosis not present

## 2016-06-18 DIAGNOSIS — J449 Chronic obstructive pulmonary disease, unspecified: Secondary | ICD-10-CM | POA: Diagnosis not present

## 2016-06-18 DIAGNOSIS — A0471 Enterocolitis due to Clostridium difficile, recurrent: Secondary | ICD-10-CM | POA: Insufficient documentation

## 2016-06-18 DIAGNOSIS — K5730 Diverticulosis of large intestine without perforation or abscess without bleeding: Secondary | ICD-10-CM | POA: Diagnosis not present

## 2016-06-18 DIAGNOSIS — Z87891 Personal history of nicotine dependence: Secondary | ICD-10-CM | POA: Insufficient documentation

## 2016-06-18 HISTORY — DX: Essential (primary) hypertension: I10

## 2016-06-18 HISTORY — PX: FECAL TRANSPLANT: SHX6383

## 2016-06-18 HISTORY — PX: COLONOSCOPY WITH PROPOFOL: SHX5780

## 2016-06-18 SURGERY — COLONOSCOPY WITH PROPOFOL
Anesthesia: General

## 2016-06-18 MED ORDER — SODIUM CHLORIDE 0.9 % IV SOLN
INTRAVENOUS | Status: DC
  Administered 2016-06-18: 1000 mL via INTRAVENOUS

## 2016-06-18 MED ORDER — PROPOFOL 10 MG/ML IV BOLUS
INTRAVENOUS | Status: DC | PRN
  Administered 2016-06-18: 100 mg via INTRAVENOUS

## 2016-06-18 MED ORDER — SODIUM CHLORIDE 0.9 % IV SOLN: INTRAVENOUS | Status: DC

## 2016-06-18 MED ORDER — PROPOFOL 500 MG/50ML IV EMUL
INTRAVENOUS | Status: DC | PRN
  Administered 2016-06-18: 120 ug/kg/min via INTRAVENOUS

## 2016-06-18 NOTE — Transfer of Care (Signed)
Immediate Anesthesia Transfer of Care Note  Patient: [name] [name]  Procedure(s) Performed: Procedure(s): COLONOSCOPY WITH PROPOFOL (N/A) FECAL TRANSPLANT (N/A)  Patient Location: PACU and Endoscopy Unit  Anesthesia Type:General  Level of Consciousness: awake, alert  and oriented  Airway & Oxygen Therapy: Patient Spontanous Breathing and Patient connected to nasal cannula oxygen  Post-op Assessment: Report given to RN and Post -op Vital signs reviewed and stable  Post vital signs: Reviewed and stable  Last Vitals:  Vitals:   06/18/16 1115 06/18/16 1116  BP: (!) (P) 107/55 (!) 107/55  Pulse: (P) 69 89  Resp: (P) 15 (!) 23  Temp: (!) (P) 35.7 C     Last Pain:  Vitals:   06/18/16 1115  TempSrc: (P) Tympanic         Complications: No apparent anesthesia complications

## 2016-06-18 NOTE — Anesthesia Postprocedure Evaluation (Signed)
Anesthesia Post Note  Patient: [name] ArisMarshall Dwight [name]  Procedure(s) Performed: Procedure(s) (LRB): COLONOSCOPY WITH PROPOFOL (N/A) FECAL TRANSPLANT (N/A)  Patient location during evaluation: Endoscopy Anesthesia Type: General Level of consciousness: awake and alert and oriented Pain management: pain level controlled Vital Signs Assessment: post-procedure vital signs reviewed and stable Respiratory status: spontaneous breathing, nonlabored ventilation and respiratory function stable Cardiovascular status: blood pressure returned to baseline and stable Postop Assessment: no signs of nausea or vomiting Anesthetic complications: no    Last Vitals:  Vitals:   06/18/16 1135 06/18/16 1145  BP: 114/64 110/60  Pulse: (!) 54 (!) 57  Resp: 15 17  Temp:      Last Pain:  Vitals:   06/18/16 1115  TempSrc: Tympanic                 Xaviar Lunn

## 2016-06-18 NOTE — H&P (Signed)
Primary Care Physician:  Louellen MolderLONDON, CHRISTIANE HARTMAN, NP Primary Gastroenterologist:  Dr. Mechele CollinElliott  Pre-Procedure History & Physical: HPI:  [name] [name] is a 82 y.o. male is here for an colonoscopy.   Past Medical History:  Diagnosis Date  . Atrial fibrillation (HCC)   . Atrial fibrillation (HCC)   . GIB (gastrointestinal bleeding)    With colonic AVMs S/P coiling  . Hyperlipidemia   . Hypertension   . Metabolic syndrome   . Paroxysmal supraventricular tachycardia (HCC)   . PMR (polymyalgia rheumatica) (HCC)   . Sick sinus syndrome with tachycardia (HCC)   . Tachycardia     Past Surgical History:  Procedure Laterality Date  . CARPAL TUNNEL RELEASE  1982   Right  . CARPAL TUNNEL RELEASE  2000   Left  . KNEE SURGERY  1997   Left    Prior to Admission medications   Medication Sig Start Date End Date Taking? Authorizing Provider  aspirin 325 MG tablet Take 325 mg by mouth daily.     Yes Historical Provider, MD  fish oil-omega-3 fatty acids 1000 MG capsule Take 2 g by mouth daily.     Yes Historical Provider, MD  furosemide (LASIX) 40 MG tablet Take one tablet on Monday, Wednesday and Friday. 01/15/11  Yes Dolores Pattyaniel R Bensimhon, MD  Lutein 6 MG CAPS Take 1 capsule by mouth daily.     Yes Historical Provider, MD  metoprolol (TOPROL-XL) 100 MG 24 hr tablet Take 1.5 tablets (150 mg total) by mouth daily. 05/01/11  Yes Antonieta Ibaimothy J Gollan, MD  Multiple Vitamins-Minerals (CENTRUM SILVER PO) Take by mouth daily.     Yes Historical Provider, MD  omeprazole (PRILOSEC) 20 MG capsule Take 1 capsule (20 mg total) by mouth daily. 01/15/11  Yes Dolores Pattyaniel R Bensimhon, MD  potassium chloride SA (K-DUR,KLOR-CON) 20 MEQ tablet Take one tablet on Mondays, Wednesday and Fridays. 01/15/11  Yes Dolores Pattyaniel R Bensimhon, MD  Psyllium-Calcium (METAMUCIL PLUS CALCIUM) CAPS Take 2 capsules by mouth daily.     Yes Historical Provider, MD  simvastatin (ZOCOR) 20 MG tablet TAKE 1 TABLET BY MOUTH AT BEDTIME  06/09/12  Yes Antonieta Ibaimothy J Gollan, MD    Allergies as of 06/13/2016  . (No Known Allergies)    Family History  Problem Relation Age of Onset  . Cancer Other   . Coronary artery disease Other     Social History   Social History  . Marital status: Married    Spouse name: N/A  . Number of children: N/A  . Years of education: N/A   Occupational History  . Retired Retired   Social History Main Topics  . Smoking status: Former Smoker    Quit date: 08/27/1974  . Smokeless tobacco: Not on file  . Alcohol use No  . Drug use: No  . Sexual activity: Not on file   Other Topics Concern  . Not on file   Social History Narrative   Married   Gets regular exercise    Review of Systems: See HPI, otherwise negative ROS  Physical Exam: BP (!) 107/55 (BP Location: Right Arm)   Pulse 89   Temp (!) 96.3 F (35.7 C) (Tympanic)   Resp (!) 23   Ht 6\' 2"  (1.88 m)   Wt 104.3 kg (230 lb)   SpO2 99%   BMI 29.53 kg/m  General:   Alert,  pleasant and cooperative in NAD Head:  Normocephalic and atraumatic. Neck:  Supple; no masses or thyromegaly. Lungs:  Clear throughout to auscultation.    Heart:  Regular rate and rhythm. Abdomen:  Soft, nontender and nondistended. Normal bowel sounds, without guarding, and without rebound.   Neurologic:  Alert and  oriented x4;  grossly normal neurologically.  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  Risks, benefits, limitations, and alternatives regarding  colonoscopy have been reviewed with the patient.  Questions have been answered.  All parties agreeable.   Lynnae Prude, MD  06/18/2016, 11:18 AM

## 2016-06-18 NOTE — Op Note (Signed)
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 Thu Baggett, MD Referring MD:         Nat ChristenMario E. Zada Finderslmedo, MD (Referring MD) Medicines:            Propofol per Anesthesia Complications:        No immediate complications. Procedure:            Pre-Anesthesia Assessment:                       - After reviewing the risks and benefits, the patient                        was deemed in satisfactory condition to undergo the                        procedure.                       After obtaining informed consent, the colonoscope was                        passed under direct vision. Throughout the procedure,                        the patient's blood pressure, pulse, and oxygen                        saturations were monitored continuously. The                        Colonoscope was introduced through the anus and                        advanced to the the cecum, identified by appendiceal                        orifice and ileocecal valve. Findings:      Multiple small and large-mouthed diverticula were found in the sigmoid       colon, descending colon and transverse colon. Fecal Microbiota       Transplant (Bacteriotherapy): Donor stool was prepared by me using milk       and tap water as per protocol. Approximately 350 mL of the emulsified       donor stool was instilled at the hepatic flexure, in the ascending colon       and in the cecum. A detailed colonoscopic exam could not be performed       upon scope  withdrawal secondary to limited  visibility from the instilled       stool. One slightly plump polyp noted but due to protocol not removed. Impression:           - Diverticulosis in the sigmoid colon, in the                        descending colon and in the transverse colon.                       - Fecal Microbiota Transplant (Bacteriotherapy)                        performed in the hepatic flexure, in the ascending                        colon and in the cecum.                       - No specimens collected. Recommendation:       - The findings and recommendations were discussed with                        the patient's family. To remain in recovery for 4 hours                        with change in position every 30 minutes. Scot Jun, MD 06/18/2016 11:16:05 AM This report has been signed electronically. Number of Addenda: 0 Note Initiated On: 06/18/2016 10:46 AM Scope Withdrawal Time: 0 hours 6 minutes 15 seconds  Total Procedure Duration: 0 hours 15 minutes 11 seconds       [facility]

## 2016-06-18 NOTE — Anesthesia Preprocedure Evaluation (Signed)
Anesthesia Evaluation  Patient identified by MRN, date of birth, ID band Patient awake    Reviewed: Allergy & Precautions, NPO status , Patient's Chart, lab work & pertinent test results  History of Anesthesia Complications Negative for: history of anesthetic complications  Airway Mallampati: II  TM Distance: >3 FB Neck ROM: Full    Dental  (+) Poor Dentition, Missing   Pulmonary sleep apnea and Continuous Positive Airway Pressure Ventilation , neg COPD, former smoker,    breath sounds clear to auscultation- rhonchi (-) wheezing      Cardiovascular hypertension, Pt. on medications and Pt. on home beta blockers (-) CAD and (-) Past MI + dysrhythmias Atrial Fibrillation + pacemaker  Rhythm:Regular Rate:Normal - Systolic murmurs and - Diastolic murmurs    Neuro/Psych negative neurological ROS  negative psych ROS   GI/Hepatic negative GI ROS, Neg liver ROS,   Endo/Other  negative endocrine ROSneg diabetes  Renal/GU negative Renal ROS     Musculoskeletal negative musculoskeletal ROS (+)   Abdominal (+) - obese,   Peds  Hematology negative hematology ROS (+)   Anesthesia Other Findings Past Medical History: No date: Atrial fibrillation (HCC) No date: Atrial fibrillation (HCC) No date: GIB (gastrointestinal bleeding)     Comment: With colonic AVMs S/P coiling No date: Hyperlipidemia No date: Hypertension No date: Metabolic syndrome No date: Paroxysmal supraventricular tachycardia (HCC) No date: PMR (polymyalgia rheumatica) (HCC) No date: Sick sinus syndrome with tachycardia (HCC) No date: Tachycardia   Reproductive/Obstetrics                             Anesthesia Physical Anesthesia Plan  ASA: III  Anesthesia Plan: General   Post-op Pain Management:    Induction: Intravenous  Airway Management Planned: Natural Airway  Additional Equipment:   Intra-op Plan:   Post-operative  Plan:   Informed Consent: I have reviewed the patients History and Physical, chart, labs and discussed the procedure including the risks, benefits and alternatives for the proposed anesthesia with the patient or authorized representative who has indicated his/her understanding and acceptance.   Dental advisory given  Plan Discussed with: CRNA and Anesthesiologist  Anesthesia Plan Comments:         Anesthesia Quick Evaluation

## 2016-06-19 ENCOUNTER — Encounter: Payer: Self-pay | Admitting: Unknown Physician Specialty

## 2016-07-14 ENCOUNTER — Ambulatory Visit
Admission: EM | Admit: 2016-07-14 | Discharge: 2016-07-14 | Disposition: A | Discharge status: Home / Self Care | Payer: Medicare Other | Attending: Family Medicine | Admitting: Family Medicine

## 2016-07-14 ENCOUNTER — Encounter: Payer: Self-pay | Admitting: Gynecology

## 2016-07-14 DIAGNOSIS — H00036 Abscess of eyelid left eye, unspecified eyelid: Secondary | ICD-10-CM

## 2016-07-14 MED ORDER — PREDNISONE 20 MG PO TABS: 20.0000 mg | ORAL_TABLET | Freq: Every day | ORAL | 0 refills | Status: DC

## 2016-07-14 MED ORDER — GENTAMICIN SULFATE 0.3 % OP SOLN: 2.0000 [drp] | Freq: Two times a day (BID) | OPHTHALMIC | 0 refills | Status: AC

## 2016-07-14 NOTE — ED Provider Notes (Signed)
CSN: [id number]     Arrival date & time 07/14/16  [id number] History   None    Chief Complaint  Patient presents with  . Eye Problem   (Consider location/radiation/quality/duration/timing/severity/associated sxs/prior Treatment) [name] is a well-appearing 82 y.o. Male, blowing leaves on Thursday afternoon and noticed his left lower eyelid started getting red and swollen that Thursday night. Patient reports some discomfort at the left lower eyelid but no pain. He denies red eye, photosensitivity, visual changes, eye discharge, matting or crusting. He reports the swelling of the eyelid is getting worst.       Past Medical History:  Diagnosis Date  . Atrial fibrillation (HCC)   . Atrial fibrillation (HCC)   . GIB (gastrointestinal bleeding)    With colonic AVMs S/P coiling  . Hyperlipidemia   . Hypertension   . Metabolic syndrome   . Paroxysmal supraventricular tachycardia (HCC)   . PMR (polymyalgia rheumatica) (HCC)   . Sick sinus syndrome with tachycardia (HCC)   . Tachycardia    Past Surgical History:  Procedure Laterality Date  . CARPAL TUNNEL RELEASE  1982   Right  . CARPAL TUNNEL RELEASE  2000   Left  . COLONOSCOPY WITH PROPOFOL N/A 06/18/2016   Procedure: COLONOSCOPY WITH PROPOFOL;  Surgeon: Scot Junobert T Elliott, MD;  [facility];  Service: Endoscopy;  Laterality: N/A;  . FECAL TRANSPLANT N/A 06/18/2016   Procedure: FECAL TRANSPLANT;  Surgeon: Scot Junobert T Elliott, MD;  [facility];  Service: Endoscopy;  Laterality: N/A;  . KNEE SURGERY  1997   Left   Family History  Problem Relation Age of Onset  . Cancer Other   . Coronary artery disease Other    Social History  Substance Use Topics  . Smoking status: Former Smoker    Quit date: 08/27/1974  . Smokeless tobacco: Never Used  . Alcohol use No    Review of Systems  All other systems reviewed and are negative.   Allergies  Patient has no known allergies.  Home Medications   Prior to  Admission medications   Medication Sig Start Date End Date Taking? Authorizing Provider  aspirin 325 MG tablet Take 325 mg by mouth daily.     Yes Historical Provider, MD  fish oil-omega-3 fatty acids 1000 MG capsule Take 2 g by mouth daily.     Yes Historical Provider, MD  furosemide (LASIX) 40 MG tablet Take one tablet on Monday, Wednesday and Friday. 01/15/11  Yes Dolores Pattyaniel R Bensimhon, MD  Lutein 6 MG CAPS Take 1 capsule by mouth daily.     Yes Historical Provider, MD  metoprolol (TOPROL-XL) 100 MG 24 hr tablet Take 1.5 tablets (150 mg total) by mouth daily. 05/01/11  Yes Antonieta Ibaimothy J Gollan, MD  Multiple Vitamins-Minerals (CENTRUM SILVER PO) Take by mouth daily.     Yes Historical Provider, MD  omeprazole (PRILOSEC) 20 MG capsule Take 1 capsule (20 mg total) by mouth daily. 01/15/11  Yes Dolores Pattyaniel R Bensimhon, MD  potassium chloride SA (K-DUR,KLOR-CON) 20 MEQ tablet Take one tablet on Mondays, Wednesday and Fridays. 01/15/11  Yes Bevelyn Bucklesaniel R Bensimhon, MD  psyllium (METAMUCIL) 58.6 % powder Take 1 packet by mouth 3 (three) times daily.   Yes Historical Provider, MD  Psyllium-Calcium (METAMUCIL PLUS CALCIUM) CAPS Take 2 capsules by mouth daily.     Yes Historical Provider, MD  rosuvastatin (CRESTOR) 5 MG tablet Take 5 mg by mouth daily.   Yes Historical Provider, MD  gentamicin (GARAMYCIN) 0.3 % ophthalmic solution Place  2 drops into the left eye 2 (two) times daily. 07/14/16 07/19/16  Lucia EstelleFeng Hammad Finkler, NP  predniSONE (DELTASONE) 20 MG tablet Take 1 tablet (20 mg total) by mouth daily with breakfast. Take 3 tablets on day 1-2, take 2 tablets on day 3-4, take 1 tablet on day 5-6. 07/14/16   Lucia EstelleFeng Selim Durden, NP  simvastatin (ZOCOR) 20 MG tablet TAKE 1 TABLET BY MOUTH AT BEDTIME 06/09/12   Antonieta Ibaimothy J Gollan, MD   Meds Ordered and Administered this Visit  Medications - No data to display  BP 107/73 (BP Location: Left Arm)   Pulse 84   Temp 97 F (36.1 C) (Tympanic)   Resp 16   Wt 229 lb (103.9 kg)   SpO2 98%   BMI  29.40 kg/m  No data found.   Physical Exam  Constitutional: He is oriented to person, place, and time. He appears well-developed and well-nourished.  HENT:  Head: Normocephalic and atraumatic.  Right Ear: External ear normal.  Left Ear: External ear normal.  Nose: Nose normal.  Mouth/Throat: Oropharynx is clear and moist.  Eyes: Conjunctivae and EOM are normal. Pupils are equal, round, and reactive to light. Left eye exhibits no discharge.  Left lower eyelid and surrounding area is red and swollen (see picture). Noted to have a small punctate at the lower mid eyelid margin. Vision intact  Neck: Normal range of motion. Neck supple.  Cardiovascular: Normal rate, regular rhythm and normal heart sounds.   Pulmonary/Chest: Effort normal and breath sounds normal.  Lymphadenopathy:    He has no cervical adenopathy.  Neurological: He is alert and oriented to person, place, and time.  Skin: Skin is warm and dry.  Nursing note and vitals reviewed.     Urgent Care Course   Clinical Course     Procedures (including critical care time)  Labs Review Labs Reviewed - No data to display  Imaging Review No results found.   Visual Acuity Review  Right Eye Distance: 20/30 Left Eye Distance: 20/30 Bilateral Distance: 20/30 (with corrective lens)      MDM   1. Eyelid cellulitis, left    Patient declined oral antibiotic as he recently had a fecal transplant and would prefer to not take oral antibiotic at this time. Supervising physician consulted; will start patient on the Gentamicin Opth and 6-day course of prednisone taper. Informed to f/u with PCP if infection does not improve on Monday. Patient states understanding and will call with questions or problems.  Discharge instruction given.      Lucia EstelleFeng Zayveon Raschke, NP 07/14/16 1128

## 2016-07-14 NOTE — ED Triage Notes (Signed)
Per patient x 3 days ago notice lower left eye-lid swollen  / red.

## 2016-08-28 ENCOUNTER — Encounter: Admission: RE | Payer: Self-pay | Source: Ambulatory Visit

## 2016-08-28 ENCOUNTER — Ambulatory Visit: Admission: RE | Admit: 2016-08-28 | Payer: Medicare Other | Source: Ambulatory Visit | Admitting: Gastroenterology

## 2016-08-28 SURGERY — COLONOSCOPY WITH PROPOFOL
Anesthesia: General

## 2016-09-04 ENCOUNTER — Ambulatory Visit: Payer: Medicare Other | Admitting: General Surgery

## 2016-09-11 ENCOUNTER — Other Ambulatory Visit
Admission: RE | Admit: 2016-09-11 | Discharge: 2016-09-11 | Disposition: A | Discharge status: Home / Self Care | Payer: Medicare Other | Source: Ambulatory Visit | Attending: Gastroenterology | Admitting: Gastroenterology

## 2016-09-11 DIAGNOSIS — R197 Diarrhea, unspecified: Secondary | ICD-10-CM | POA: Diagnosis present

## 2016-09-11 LAB — C DIFFICILE QUICK SCREEN W PCR REFLEX
C DIFFICILE (CDIFF) INTERP: NOT DETECTED
C DIFFICILE (CDIFF) TOXIN: NEGATIVE
C DIFFICLE (CDIFF) ANTIGEN: NEGATIVE

## 2017-09-15 ENCOUNTER — Encounter: Payer: Self-pay | Admitting: Gynecology

## 2017-09-15 ENCOUNTER — Ambulatory Visit: Payer: Medicare Other

## 2017-09-15 ENCOUNTER — Ambulatory Visit
Admission: EM | Admit: 2017-09-15 | Discharge: 2017-09-15 | Disposition: A | Discharge status: Home / Self Care | Payer: Medicare Other | Attending: Family Medicine | Admitting: Family Medicine

## 2017-09-15 ENCOUNTER — Other Ambulatory Visit: Payer: Self-pay

## 2017-09-15 DIAGNOSIS — M353 Polymyalgia rheumatica: Secondary | ICD-10-CM | POA: Diagnosis not present

## 2017-09-15 DIAGNOSIS — I4891 Unspecified atrial fibrillation: Secondary | ICD-10-CM | POA: Diagnosis not present

## 2017-09-15 DIAGNOSIS — E785 Hyperlipidemia, unspecified: Secondary | ICD-10-CM | POA: Insufficient documentation

## 2017-09-15 DIAGNOSIS — Z87891 Personal history of nicotine dependence: Secondary | ICD-10-CM | POA: Diagnosis not present

## 2017-09-15 DIAGNOSIS — W19XXXA Unspecified fall, initial encounter: Secondary | ICD-10-CM | POA: Insufficient documentation

## 2017-09-15 DIAGNOSIS — E8881 Metabolic syndrome: Secondary | ICD-10-CM | POA: Diagnosis not present

## 2017-09-15 DIAGNOSIS — M79674 Pain in right toe(s): Secondary | ICD-10-CM | POA: Diagnosis present

## 2017-09-15 DIAGNOSIS — I10 Essential (primary) hypertension: Secondary | ICD-10-CM | POA: Insufficient documentation

## 2017-09-15 DIAGNOSIS — Z7982 Long term (current) use of aspirin: Secondary | ICD-10-CM | POA: Insufficient documentation

## 2017-09-15 DIAGNOSIS — I495 Sick sinus syndrome: Secondary | ICD-10-CM | POA: Diagnosis not present

## 2017-09-15 DIAGNOSIS — Z79899 Other long term (current) drug therapy: Secondary | ICD-10-CM | POA: Diagnosis not present

## 2017-09-15 DIAGNOSIS — S92521A Displaced fracture of medial phalanx of right lesser toe(s), initial encounter for closed fracture: Secondary | ICD-10-CM | POA: Diagnosis not present

## 2017-09-15 NOTE — ED Provider Notes (Signed)
[facility]    CSN: [id number] Arrival date & time: 09/15/17  4098     History   Chief Complaint Chief Complaint  Patient presents with  . Foot Pain    HPI Nijah Orlich is a 84 y.o. male.   84 yo male with a c/o pain and swelling for the past 4-5 days to his 2nd and 3rd right foot toes. States he fell about 2 weeks ago but did not notice pain or swelling at the time. He denies any other injuries.    The history is provided by the patient.  Foot Pain     Past Medical History:  Diagnosis Date  . Atrial fibrillation (HCC)   . Atrial fibrillation (HCC)   . GIB (gastrointestinal bleeding)    With colonic AVMs S/P coiling  . Hyperlipidemia   . Hypertension   . Metabolic syndrome   . Paroxysmal supraventricular tachycardia (HCC)   . PMR (polymyalgia rheumatica) (HCC)   . Sick sinus syndrome with tachycardia (HCC)   . Tachycardia     Patient Active Problem List   Diagnosis Date Noted  . EDEMA 12/07/2009  . METABOLIC SYNDROME X 01/14/2009  . HYPERLIPIDEMIA 12/16/2008  . ATRIAL FIBRILLATION 12/16/2008    Past Surgical History:  Procedure Laterality Date  . CARPAL TUNNEL RELEASE  1982   Right  . CARPAL TUNNEL RELEASE  2000   Left  . COLONOSCOPY WITH PROPOFOL N/A 06/18/2016   Procedure: COLONOSCOPY WITH PROPOFOL;  Surgeon: Scot Jun, MD;  Location: [facility] ENDOSCOPY;  Service: Endoscopy;  Laterality: N/A;  . 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 KNEE SURGERY  1997   Left       Home Medications    Prior to Admission medications   Medication Sig Start Date End Date Taking? Authorizing Provider  aspirin 325 MG tablet Take 325 mg by mouth daily.     Yes [provider]  fish oil-omega-3 fatty acids 1000 MG capsule Take 2 g by mouth daily.     Yes [provider]  furosemide (LASIX) 40 MG  tablet Take one tablet on Monday, Wednesday and Friday. 01/15/11  Yes Bensimhon, Bevelyn Buckles, MD  Lutein 6 MG CAPS Take 1 capsule by mouth daily.     Yes [provider]  metoprolol (TOPROL-XL) 100 MG 24 hr tablet Take 1.5 tablets (150 mg total) by mouth daily. 05/01/11  Yes Antonieta Iba, MD  Multiple Vitamins-Minerals (CENTRUM SILVER PO) Take by mouth daily.     Yes [provider]  omeprazole (PRILOSEC) 20 MG capsule Take 1 capsule (20 mg total) by mouth daily. 01/15/11  Yes Bensimhon, Bevelyn Buckles, MD  psyllium (METAMUCIL) 58.6 % powder Take 1 packet by mouth 3 (three) times daily.   Yes [provider]  Psyllium-Calcium (METAMUCIL PLUS CALCIUM) CAPS Take 2 capsules by mouth daily.     Yes [provider]  rosuvastatin (CRESTOR) 5 MG tablet Take 5 mg by mouth daily.   Yes [provider]  potassium chloride SA (K-DUR,KLOR-CON) 20 MEQ tablet Take one tablet on Mondays, Wednesday and Fridays. 01/15/11   Bensimhon, Bevelyn Buckles, MD  predniSONE (DELTASONE) 20 MG tablet Take 1 tablet (20 mg total) by mouth daily with breakfast. Take 3 tablets on day 1-2, take 2 tablets on day 3-4, take 1 tablet on day 5-6. 07/14/16   Lucia Estelle,  NP  simvastatin (ZOCOR) 20 MG tablet TAKE 1 TABLET BY MOUTH AT BEDTIME 06/09/12   Antonieta Iba, MD    Family History Family History  Problem Relation Age of Onset  . Cancer Other   . Coronary artery disease Other     Social History Social History   Tobacco Use  . Smoking status: Former Smoker    Last attempt to quit: 08/27/1974    Years since quitting: 43.0  . Smokeless tobacco: Never Used  Substance Use Topics  . Alcohol use: No  . Drug use: No     Allergies   Patient has no known allergies.   Review of Systems Review of Systems   Physical Exam Triage Vital Signs ED Triage Vitals  Enc Vitals Group     BP 09/15/17 0922 125/68     Pulse Rate 09/15/17 0922 83     Resp 09/15/17 0922 18     Temp 09/15/17 0922  97.9 F (36.6 C)     Temp Source 09/15/17 0922 Oral     SpO2 09/15/17 0922 100 %     Weight 09/15/17 0921 238 lb (108 kg)     Height 09/15/17 0921 6\' 2"  (1.88 m)     Head Circumference --      Peak Flow --      Pain Score 09/15/17 0922 5     Pain Loc --      Pain Edu? --      Excl. in GC? --    No data found.  Updated Vital Signs BP 125/68 (BP Location: Left Arm)   Pulse 83   Temp 97.9 F (36.6 C) (Oral)   Resp 18   Ht 6\' 2"  (1.88 m)   Wt 238 lb (108 kg)   SpO2 100%   BMI 30.56 kg/m   Visual Acuity Right Eye Distance:   Left Eye Distance:   Bilateral Distance:    Right Eye Near:   Left Eye Near:    Bilateral Near:     Physical Exam  Constitutional: He appears well-developed and well-nourished. No distress.  Musculoskeletal:       Right foot: There is bony tenderness (over the second and third toes) and swelling. There is normal capillary refill, no crepitus, no deformity and no laceration.  Skin: He is not diaphoretic.  Nursing note and vitals reviewed.    UC Treatments / Results  Labs (all labs ordered are listed, but only abnormal results are displayed) Labs Reviewed - No data to display  EKG  EKG Interpretation None       Radiology Dg Toe 2nd Right  Result Date: 09/15/2017 CLINICAL DATA:  PAIN AND REDNESS EXAM: RIGHT SECOND AND THIRD TOES: 3 VIEWS EACH TOE COMPARISON:  None. FINDINGS: Right third toe: Frontal, oblique, and lateral views were obtained. There is an avulsion along the dorsal aspect of the proximal aspect of the third middle phalanx. No other fracture. No dislocation. There is moderate narrowing of the third PIP and DIP joints. No erosive change. Right second toe: Frontal, oblique, and lateral views obtained. No fracture or dislocation. There is narrowing of the second PIP and DIP joints. No erosive change. IMPRESSION: Avulsion fracture along the dorsal aspect of the proximal portion of the third middle phalanx. No other fracture. No  dislocation. Narrowing of the second and third PIP and DIP joints. No erosive change or bony destruction. These results will be called to the ordering clinician or representative by the Radiologist Assistant, and  communication documented in the PACS or zVision Dashboard. Electronically Signed   By: Bretta BangWilliam  Woodruff III M.D.   On: 09/15/2017 09:57   Dg Toe 3rd Right  Result Date: 09/15/2017 CLINICAL DATA:  PAIN AND REDNESS EXAM: RIGHT SECOND AND THIRD TOES: 3 VIEWS EACH TOE COMPARISON:  None. FINDINGS: Right third toe: Frontal, oblique, and lateral views were obtained. There is an avulsion along the dorsal aspect of the proximal aspect of the third middle phalanx. No other fracture. No dislocation. There is moderate narrowing of the third PIP and DIP joints. No erosive change. Right second toe: Frontal, oblique, and lateral views obtained. No fracture or dislocation. There is narrowing of the second PIP and DIP joints. No erosive change. IMPRESSION: Avulsion fracture along the dorsal aspect of the proximal portion of the third middle phalanx. No other fracture. No dislocation. Narrowing of the second and third PIP and DIP joints. No erosive change or bony destruction. These results will be called to the ordering clinician or representative by the Radiologist Assistant, and communication documented in the PACS or zVision Dashboard. Electronically Signed   By: Bretta BangWilliam  Woodruff III M.D.   On: 09/15/2017 09:57    Procedures Procedures (including critical care time)  Medications Ordered in UC Medications - No data to display   Initial Impression / Assessment and Plan / UC Course  I have reviewed the triage vital signs and the nursing notes.  Pertinent labs & imaging results that were available during my care of the patient were reviewed by me and considered in my medical decision making (see chart for details).      Final Clinical Impressions(s) / UC Diagnoses   Final diagnoses:  Closed  displaced fracture of middle phalanx of lesser toe of right foot, initial encounter    ED Discharge Orders    None     1. x-ray results and diagnosis reviewed with patient 2. Recommend treatment with buddy taping and hard soled shoe 3. Supportive tx with otc analgesics prn, rest, ice 4. Follow-up prn if symptoms worsen or don't improve  Controlled Substance Prescriptions [place] Controlled Substance Registry consulted? Not Applicable   Payton Mccallumonty, Jandel Patriarca, MD 09/15/17 1026

## 2017-09-15 NOTE — ED Triage Notes (Signed)
Per patient fallen x 2 week ago after leaving a restaurant trip over a flower bed. Patient stated x 3 days with pain and swelling in right foot.

## 2017-09-18 ENCOUNTER — Telehealth: Payer: Self-pay

## 2017-09-18 NOTE — Telephone Encounter (Signed)
Call[facility] to [facility] with [facility]. Spoke with pt. Patient reports improvement. Patient instructed to call back with any questions or concerns. [name]

## 2017-10-04 ENCOUNTER — Ambulatory Visit
Admission: EM | Admit: 2017-10-04 | Discharge: 2017-10-04 | Disposition: A | Discharge status: Home / Self Care | Payer: Medicare Other | Attending: Family Medicine | Admitting: Family Medicine

## 2017-10-04 ENCOUNTER — Ambulatory Visit: Payer: Medicare Other

## 2017-10-04 DIAGNOSIS — E785 Hyperlipidemia, unspecified: Secondary | ICD-10-CM | POA: Insufficient documentation

## 2017-10-04 DIAGNOSIS — I4891 Unspecified atrial fibrillation: Secondary | ICD-10-CM | POA: Diagnosis not present

## 2017-10-04 DIAGNOSIS — M19079 Primary osteoarthritis, unspecified ankle and foot: Secondary | ICD-10-CM | POA: Diagnosis not present

## 2017-10-04 DIAGNOSIS — Z7982 Long term (current) use of aspirin: Secondary | ICD-10-CM | POA: Insufficient documentation

## 2017-10-04 DIAGNOSIS — R609 Edema, unspecified: Secondary | ICD-10-CM | POA: Diagnosis not present

## 2017-10-04 DIAGNOSIS — Z79899 Other long term (current) drug therapy: Secondary | ICD-10-CM | POA: Diagnosis not present

## 2017-10-04 DIAGNOSIS — Z8249 Family history of ischemic heart disease and other diseases of the circulatory system: Secondary | ICD-10-CM | POA: Diagnosis not present

## 2017-10-04 DIAGNOSIS — Z7952 Long term (current) use of systemic steroids: Secondary | ICD-10-CM | POA: Insufficient documentation

## 2017-10-04 DIAGNOSIS — Z9889 Other specified postprocedural states: Secondary | ICD-10-CM | POA: Insufficient documentation

## 2017-10-04 DIAGNOSIS — I495 Sick sinus syndrome: Secondary | ICD-10-CM | POA: Diagnosis not present

## 2017-10-04 DIAGNOSIS — W19XXXD Unspecified fall, subsequent encounter: Secondary | ICD-10-CM | POA: Insufficient documentation

## 2017-10-04 DIAGNOSIS — I10 Essential (primary) hypertension: Secondary | ICD-10-CM | POA: Insufficient documentation

## 2017-10-04 DIAGNOSIS — M353 Polymyalgia rheumatica: Secondary | ICD-10-CM | POA: Diagnosis not present

## 2017-10-04 DIAGNOSIS — M25571 Pain in right ankle and joints of right foot: Secondary | ICD-10-CM | POA: Insufficient documentation

## 2017-10-04 DIAGNOSIS — I471 Supraventricular tachycardia: Secondary | ICD-10-CM | POA: Diagnosis not present

## 2017-10-04 DIAGNOSIS — Z87891 Personal history of nicotine dependence: Secondary | ICD-10-CM | POA: Insufficient documentation

## 2017-10-04 NOTE — ED Triage Notes (Signed)
Pt said after he took his boot off for his foot pain states his right ankle is now hurting him and keeping him up at night. He didn't mention it at the time because it didn't start bothering him until he removed his boot. Said when he bear pressure on it is when it begins to hurt. Said he didn't think they took a xray of the ankle just the foot. York SpanielSaid he has been taking Advil 3 times a day with relief for a short period of time.

## 2017-10-04 NOTE — ED Provider Notes (Signed)
[facility]    CSN: [id number] Arrival date & time: 10/04/17  1617     History   Chief Complaint Chief Complaint  Patient presents with  . Fall    HPI Timarion Agcaoili is a 84 y.o. male.   84 yo male with a c/o right ankle pain for the past week, worse when walking. Patient was seen about 2 weeks ago with a right foot injury with 2 toe fractures which have been improving. Denies any redness, swelling, fevers, chills.    The history is provided by the patient.  Fall     Past Medical History:  Diagnosis Date  . Atrial fibrillation (HCC)   . Atrial fibrillation (HCC)   . GIB (gastrointestinal bleeding)    With colonic AVMs S/P coiling  . Hyperlipidemia   . Hypertension   . Metabolic syndrome   . Paroxysmal supraventricular tachycardia (HCC)   . PMR (polymyalgia rheumatica) (HCC)   . Sick sinus syndrome with tachycardia (HCC)   . Tachycardia     Patient Active Problem List   Diagnosis Date Noted  . EDEMA 12/07/2009  . METABOLIC SYNDROME X 01/14/2009  . HYPERLIPIDEMIA 12/16/2008  . ATRIAL FIBRILLATION 12/16/2008    Past Surgical History:  Procedure Laterality Date  . CARPAL TUNNEL RELEASE  1982   Right  . CARPAL TUNNEL RELEASE  2000   Left  . COLONOSCOPY WITH PROPOFOL N/A 06/18/2016   Procedure: COLONOSCOPY WITH PROPOFOL;  Surgeon: Scot Jun, MD;  Location: [facility] ENDOSCOPY;  Service: Endoscopy;  Laterality: N/A;  . 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 KNEE SURGERY  1997   Left       Home Medications    Prior to Admission medications   Medication Sig Start Date End Date Taking? Authorizing Provider  aspirin 325 MG tablet Take 325 mg by mouth daily.      [provider]  fish oil-omega-3 fatty acids 1000 MG capsule Take 2 g by mouth daily.      [provider]  furosemide (LASIX) 40 MG  tablet Take one tablet on Monday, Wednesday and Friday. 01/15/11   Bensimhon, Bevelyn Buckles, MD  Lutein 6 MG CAPS Take 1 capsule by mouth daily.      [provider]  metoprolol (TOPROL-XL) 100 MG 24 hr tablet Take 1.5 tablets (150 mg total) by mouth daily. 05/01/11   Antonieta Iba, MD  Multiple Vitamins-Minerals (CENTRUM SILVER PO) Take by mouth daily.      [provider]  omeprazole (PRILOSEC) 20 MG capsule Take 1 capsule (20 mg total) by mouth daily. 01/15/11   Bensimhon, Bevelyn Buckles, MD  potassium chloride SA (K-DUR,KLOR-CON) 20 MEQ tablet Take one tablet on Mondays, Wednesday and Fridays. 01/15/11   Bensimhon, Bevelyn Buckles, MD  predniSONE (DELTASONE) 20 MG tablet Take 1 tablet (20 mg total) by mouth daily with breakfast. Take 3 tablets on day 1-2, take 2 tablets on day 3-4, take 1 tablet on day 5-6. 07/14/16   Lucia Estelle, NP  psyllium (METAMUCIL) 58.6 % powder Take 1 packet by mouth 3 (three) times daily.    [provider]  Psyllium-Calcium (METAMUCIL PLUS CALCIUM) CAPS Take 2 capsules by mouth daily.      [provider]  rosuvastatin (CRESTOR) 5 MG tablet Take 5 mg by mouth daily.    [provider]  simvastatin (ZOCOR)  20 MG tablet TAKE 1 TABLET BY MOUTH AT BEDTIME 06/09/12   Antonieta IbaGollan, Timothy J, MD    Family History Family History  Problem Relation Age of Onset  . Cancer Other   . Coronary artery disease Other     Social History Social History   Tobacco Use  . Smoking status: Former Smoker    Last attempt to quit: 08/27/1974    Years since quitting: 43.1  . Smokeless tobacco: Never Used  Substance Use Topics  . Alcohol use: No  . Drug use: No     Allergies   Patient has no known allergies.   Review of Systems Review of Systems   Physical Exam Triage Vital Signs ED Triage Vitals  Enc Vitals Group     BP 10/04/17 1646 108/64     Pulse Rate 10/04/17 1646 72     Resp 10/04/17 1646 18     Temp 10/04/17 1646 98.5 F (36.9 C)      Temp Source 10/04/17 1646 Oral     SpO2 10/04/17 1646 95 %     Weight --      Height --      Head Circumference --      Peak Flow --      Pain Score 10/04/17 1650 4     Pain Loc --      Pain Edu? --      Excl. in GC? --    No data found.  Updated Vital Signs BP 108/64 (BP Location: Left Arm)   Pulse 72   Temp 98.5 F (36.9 C) (Oral)   Resp 18   SpO2 95%   Visual Acuity Right Eye Distance:   Left Eye Distance:   Bilateral Distance:    Right Eye Near:   Left Eye Near:    Bilateral Near:     Physical Exam  Constitutional: He appears well-developed and well-nourished. No distress.  Musculoskeletal:       Right ankle: He exhibits normal range of motion, no swelling, no ecchymosis, no deformity, no laceration and normal pulse. No tenderness. Achilles tendon normal.  Skin: He is not diaphoretic.  Nursing note and vitals reviewed.    UC Treatments / Results  Labs (all labs ordered are listed, but only abnormal results are displayed) Labs Reviewed - No data to display  EKG  EKG Interpretation None       Radiology Dg Ankle Complete Right  Result Date: 10/04/2017 CLINICAL DATA:  Posterior right ankle pain since a fall 1 week ago. EXAM: RIGHT ANKLE - COMPLETE 3+ VIEW COMPARISON:  None. FINDINGS: Mild-to-moderate diffuse soft tissue swelling. Talotibial joint degenerative changes with degenerative spur formation between the lateral malleolus and adjacent talus as well. Mild posterior calcaneal spur formation. No fracture, dislocation or effusion. IMPRESSION: 1. Soft tissue swelling without fracture. 2. Degenerative changes. Electronically Signed   By: Beckie SaltsSteven  Reid M.D.   On: 10/04/2017 17:16    Procedures Procedures (including critical care time)  Medications Ordered in UC Medications - No data to display   Initial Impression / Assessment and Plan / UC Course  I have reviewed the triage vital signs and the nursing notes.  Pertinent labs & imaging results that were  available during my care of the patient were reviewed by me and considered in my medical decision making (see chart for details).       Final Clinical Impressions(s) / UC Diagnoses   Final diagnoses:  Arthritis of ankle    ED Discharge  Orders    None     1. x-ray results (negative for fracture; positive for degenerative changes) and diagnosis reviewed with patient 2. Recommend supportive treatment with otc analgesics prn (offered stronger pain medication rx, however patient refuses) 3. Follow-up prn if symptoms worsen or don't improve  Controlled Substance Prescriptions [place] Controlled Substance Registry consulted? Not Applicable   Payton Mccallum, MD 10/04/17 [phone]

## 2017-10-07 ENCOUNTER — Telehealth: Payer: Self-pay

## 2017-10-07 NOTE — Telephone Encounter (Signed)
Called to follow up with patient [facility].Spoke with pt, reports that he went to [facility] today for a broken bone. Patient instructed to call back with any questions or concerns. [facility]

## 2017-10-29 ENCOUNTER — Other Ambulatory Visit: Payer: Self-pay | Admitting: Physician Assistant

## 2017-10-29 DIAGNOSIS — M25571 Pain in right ankle and joints of right foot: Secondary | ICD-10-CM

## 2017-11-08 ENCOUNTER — Ambulatory Visit
Admission: RE | Admit: 2017-11-08 | Discharge: 2017-11-08 | Disposition: A | Discharge status: Home / Self Care | Payer: Medicare Other | Source: Ambulatory Visit | Attending: Physician Assistant | Admitting: Physician Assistant

## 2017-11-08 DIAGNOSIS — M25571 Pain in right ankle and joints of right foot: Secondary | ICD-10-CM | POA: Insufficient documentation

## 2017-11-08 DIAGNOSIS — M7731 Calcaneal spur, right foot: Secondary | ICD-10-CM | POA: Diagnosis not present

## 2017-11-12 ENCOUNTER — Encounter: Payer: Self-pay | Admitting: *Deleted

## 2017-11-12 ENCOUNTER — Ambulatory Visit
Admission: EM | Admit: 2017-11-12 | Discharge: 2017-11-12 | Disposition: A | Discharge status: Home / Self Care | Payer: Medicare Other | Attending: Family Medicine | Admitting: Family Medicine

## 2017-11-12 DIAGNOSIS — S30860A Insect bite (nonvenomous) of lower back and pelvis, initial encounter: Secondary | ICD-10-CM | POA: Diagnosis not present

## 2017-11-12 DIAGNOSIS — W57XXXA Bitten or stung by nonvenomous insect and other nonvenomous arthropods, initial encounter: Secondary | ICD-10-CM | POA: Diagnosis not present

## 2017-11-12 DIAGNOSIS — S30850A Superficial foreign body of lower back and pelvis, initial encounter: Secondary | ICD-10-CM

## 2017-11-12 MED ORDER — DOXYCYCLINE HYCLATE 100 MG PO CAPS: 100.0000 mg | ORAL_CAPSULE | Freq: Two times a day (BID) | ORAL | 0 refills | Status: AC

## 2017-11-12 NOTE — ED Provider Notes (Signed)
[facility]    CSN: [id number] Arrival date & time: 11/12/17  1004     History   Chief Complaint Chief Complaint  Patient presents with  . Insect Bite    HPI [name] [name] is a 84 y.o. male.   Patient is a 84 year old male who presents with complaint of a tick bite on his buttocks.  Patient states initially he thought it was an abscess/boil that he was treating with an antibiotic ointment.  However, he has his wife yesterday to look at it and she thought something was in there.  He states that she removed a tick but it came out in pieces, and she reported that she thought there was something left in there.  Patient comes in today for further evaluation and possible removal of what is left.  Patient denies any fever or chills.  He is taking no over-the-counter medications for this other than the use of the triple antibiotic ointment at home.  He reports that it itches but is not painful to the touch and it is not painful when he walks.      Past Medical History:  Diagnosis Date  . Atrial fibrillation (HCC)   . Atrial fibrillation (HCC)   . GIB (gastrointestinal bleeding)    With colonic AVMs S/P coiling  . Hyperlipidemia   . Hypertension   . Metabolic syndrome   . Paroxysmal supraventricular tachycardia (HCC)   . PMR (polymyalgia rheumatica) (HCC)   . Sick sinus syndrome with tachycardia (HCC)   . Tachycardia     Patient Active Problem List   Diagnosis Date Noted  . EDEMA 12/07/2009  . METABOLIC SYNDROME X 01/14/2009  . HYPERLIPIDEMIA 12/16/2008  . ATRIAL FIBRILLATION 12/16/2008    Past Surgical History:  Procedure Laterality Date  . CARPAL TUNNEL RELEASE  1982   Right  . CARPAL TUNNEL RELEASE  2000   Left  . COLONOSCOPY WITH PROPOFOL N/A 06/18/2016   Procedure: COLONOSCOPY WITH PROPOFOL;  Surgeon: Scot Junobert T Elliott, MD;  [facility];  Service: Endoscopy;  Laterality: N/A;  . 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 KNEE SURGERY  1997   Left       Home Medications    Prior to Admission medications   Medication Sig Start Date End Date Taking? Authorizing Provider  aspirin 325 MG tablet Take 325 mg by mouth daily.      [provider]  doxycycline (VIBRAMYCIN) 100 MG capsule Take 1 capsule (100 mg total) by mouth 2 (two) times daily for 14 days. 11/12/17 11/26/17  Candis SchatzHarris, Ismelda Weatherman D, PA-C  fish oil-omega-3 fatty acids 1000 MG capsule Take 2 g by mouth daily.      [provider]  furosemide (LASIX) 40 MG tablet Take one tablet on Monday, Wednesday and Friday. 01/15/11   Bensimhon, Bevelyn Bucklesaniel R, MD  Lutein 6 MG CAPS Take 1 capsule by mouth daily.      [provider]  metoprolol (TOPROL-XL) 100 MG 24 hr tablet Take 1.5 tablets (150 mg total) by mouth daily. 05/01/11   Antonieta IbaGollan, Timothy J, MD  Multiple Vitamins-Minerals (CENTRUM SILVER PO) Take by mouth daily.      [provider]  omeprazole (PRILOSEC) 20 MG capsule Take 1 capsule (20 mg total) by mouth daily. 01/15/11   Bensimhon, Bevelyn Bucklesaniel R, MD  potassium chloride SA (K-DUR,KLOR-CON) 20 MEQ tablet Take  one tablet on Mondays, Wednesday and Fridays. 01/15/11   Bensimhon, Bevelyn Buckles, MD  predniSONE (DELTASONE) 20 MG tablet Take 1 tablet (20 mg total) by mouth daily with breakfast. Take 3 tablets on day 1-2, take 2 tablets on day 3-4, take 1 tablet on day 5-6. 07/14/16   Lucia Estelle, NP  psyllium (METAMUCIL) 58.6 % powder Take 1 packet by mouth 3 (three) times daily.    [provider]  Psyllium-Calcium (METAMUCIL PLUS CALCIUM) CAPS Take 2 capsules by mouth daily.      [provider]  rosuvastatin (CRESTOR) 5 MG tablet Take 5 mg by mouth daily.    [provider]  simvastatin (ZOCOR) 20 MG tablet TAKE 1 TABLET BY MOUTH AT BEDTIME 06/09/12   Antonieta Iba, MD    Family History Family History    Problem Relation Age of Onset  . Cancer Other   . Coronary artery disease Other   . Other Mother   . Other Father     Social History Social History   Tobacco Use  . Smoking status: Former Smoker    Last attempt to quit: 08/27/1974    Years since quitting: 43.2  . Smokeless tobacco: Never Used  Substance Use Topics  . Alcohol use: No  . Drug use: No     Allergies   Patient has no known allergies.   Review of Systems Review of Systems  As noted above in HPI.  Other systems reviewed and found to be negative   Physical Exam Triage Vital Signs ED Triage Vitals  Enc Vitals Group     BP 11/12/17 1018 114/69     Pulse Rate 11/12/17 1018 82     Resp 11/12/17 1018 16     Temp 11/12/17 1033 98 F (36.7 C)     Temp Source 11/12/17 1033 Oral     SpO2 11/12/17 1018 99 %     Weight 11/12/17 1022 240 lb (108.9 kg)     Height 11/12/17 1022 6\' 2"  (1.88 m)     Head Circumference --      Peak Flow --      Pain Score --      Pain Loc --      Pain Edu? --      Excl. in GC? --    No data found.  Updated Vital Signs BP 114/69 (BP Location: Left Arm)   Pulse 82   Temp 98 F (36.7 C) (Oral)   Resp 16   Ht 6\' 2"  (1.88 m)   Wt 240 lb (108.9 kg)   SpO2 99%   BMI 30.81 kg/m    Physical Exam  Constitutional: He is oriented to person, place, and time. He appears well-developed and well-nourished. No distress.  Eyes: EOM are normal. Pupils are equal, round, and reactive to light.  Neck: Normal range of motion.  Pulmonary/Chest: Effort normal. No respiratory distress.  Musculoskeletal: Normal range of motion. He exhibits no edema.  Neurological: He is alert and oriented to person, place, and time.  Skin:        UC Treatments / Results  Labs (all labs ordered are listed, but only abnormal results are displayed) Labs Reviewed - No data to display  EKG  EKG Interpretation None       Radiology No results found.  Procedures Foreign Body Removal Date/Time:  11/12/2017 10:59 AM Performed by: Candis Schatz, PA-C Authorized by: Tommie Sams, DO   Consent:    Consent obtained:  Verbal   Consent given by:  Patient   Risks discussed:  Bleeding and infection Location:    Location: Left buttocks.   Depth: superficial. Pre-procedure details:    Imaging:  None   Neurovascular status: intact   Anesthesia (see MAR for exact dosages):    Anesthesia method:  Local infiltration   Local anesthetic:  Lidocaine 1% WITH epi Procedure type:    Procedure complexity:  Simple Procedure details:    Scalpel size:  11   Localization method:  Visualized   Dissection of underlying tissues: no     Removal mechanism:  Forceps   Foreign bodies recovered: small round foreign body, ~ 1mm. no other bodies noted. Post-procedure details:    Skin closure:  None   Dressing:  Antibiotic ointment and adhesive bandage   Patient tolerance of procedure:  Tolerated well, no immediate complications   (including critical care time)  Medications Ordered in UC Medications - No data to display   Initial Impression / Assessment and Plan / UC Course  I have reviewed the triage vital signs and the nursing notes.  Pertinent labs & imaging results that were available during my care of the patient were reviewed by me and considered in my medical decision making (see chart for details).     Patient with tick removed yesterday but in pieces.  Increased redness today.  Mild area of redness and induration to her left buttocks.  With opening, there was one small round piece of foreign body that was removed.  Nothing else noted.  Also give patient prescription for doxycycline for coverage for 14 days.  Have him follow-up with his primary care provider for any further symptoms.  Final Clinical Impressions(s) / UC Diagnoses   Final diagnoses:  Tick bite, initial encounter    ED Discharge Orders        Ordered    doxycycline (VIBRAMYCIN) 100 MG capsule  2 times daily      11/12/17 1102       Controlled Substance Prescriptions [place] Controlled Substance Registry consulted? Not Applicable   Candis Schatz, PA-C 11/12/17 1105

## 2017-11-12 NOTE — ED Triage Notes (Signed)
Pt removed tick from left buttock yesterday. Site is now inflamed and painful.

## 2017-11-12 NOTE — Discharge Instructions (Signed)
-  Doxycycline: 1 tablet twice a day for 14 days -Keep wound clean with warm soapy water. -Follow with primary care provider this clinic should wound shows signs of worsening infection related develop fever, chills -Follow-up with primary care provider in regards to tick bite so that she can watch for any related symptoms.

## 2018-07-30 IMAGING — CR DG ANKLE COMPLETE 3+V*R*
3 series · 3 of 3 positions shown · non-contrast
Comparison: None.

CLINICAL DATA: Posterior right ankle pain since a fall 1 week ago.

EXAM:
RIGHT ANKLE - COMPLETE 3+ VIEW

[ankle ap]
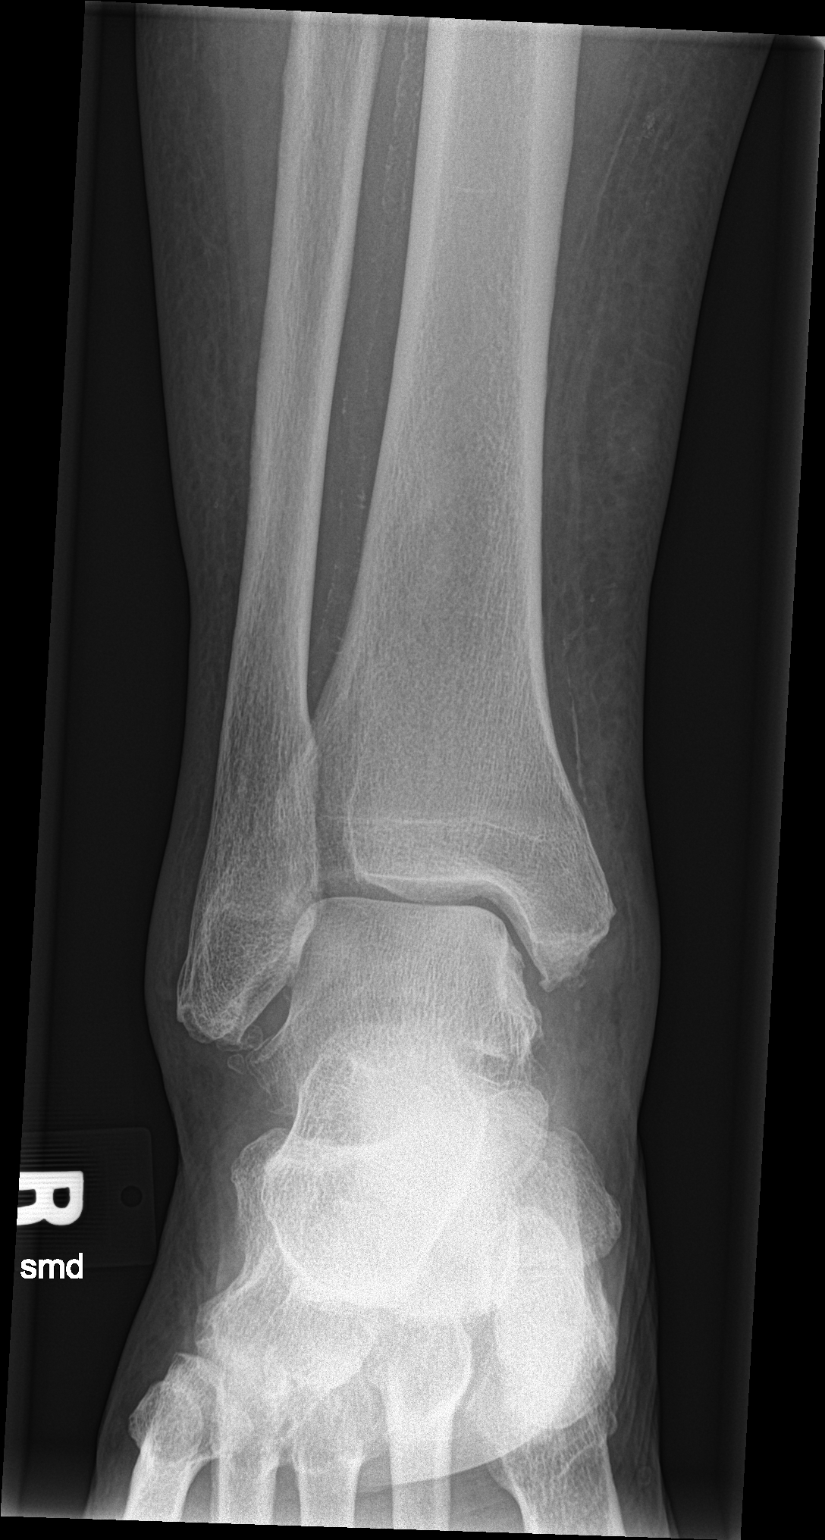

[ankle obl]
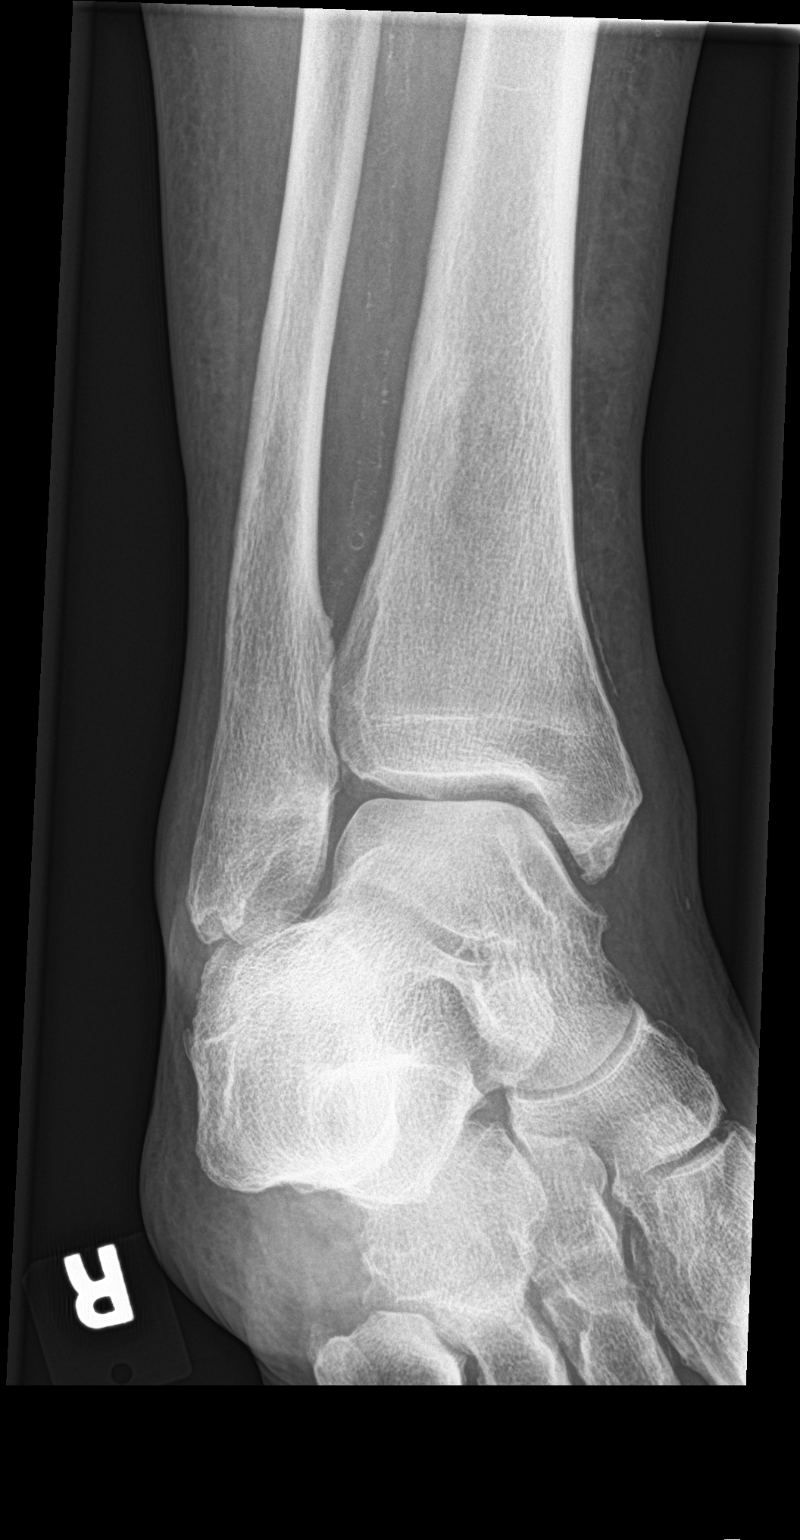

[ankle lat]
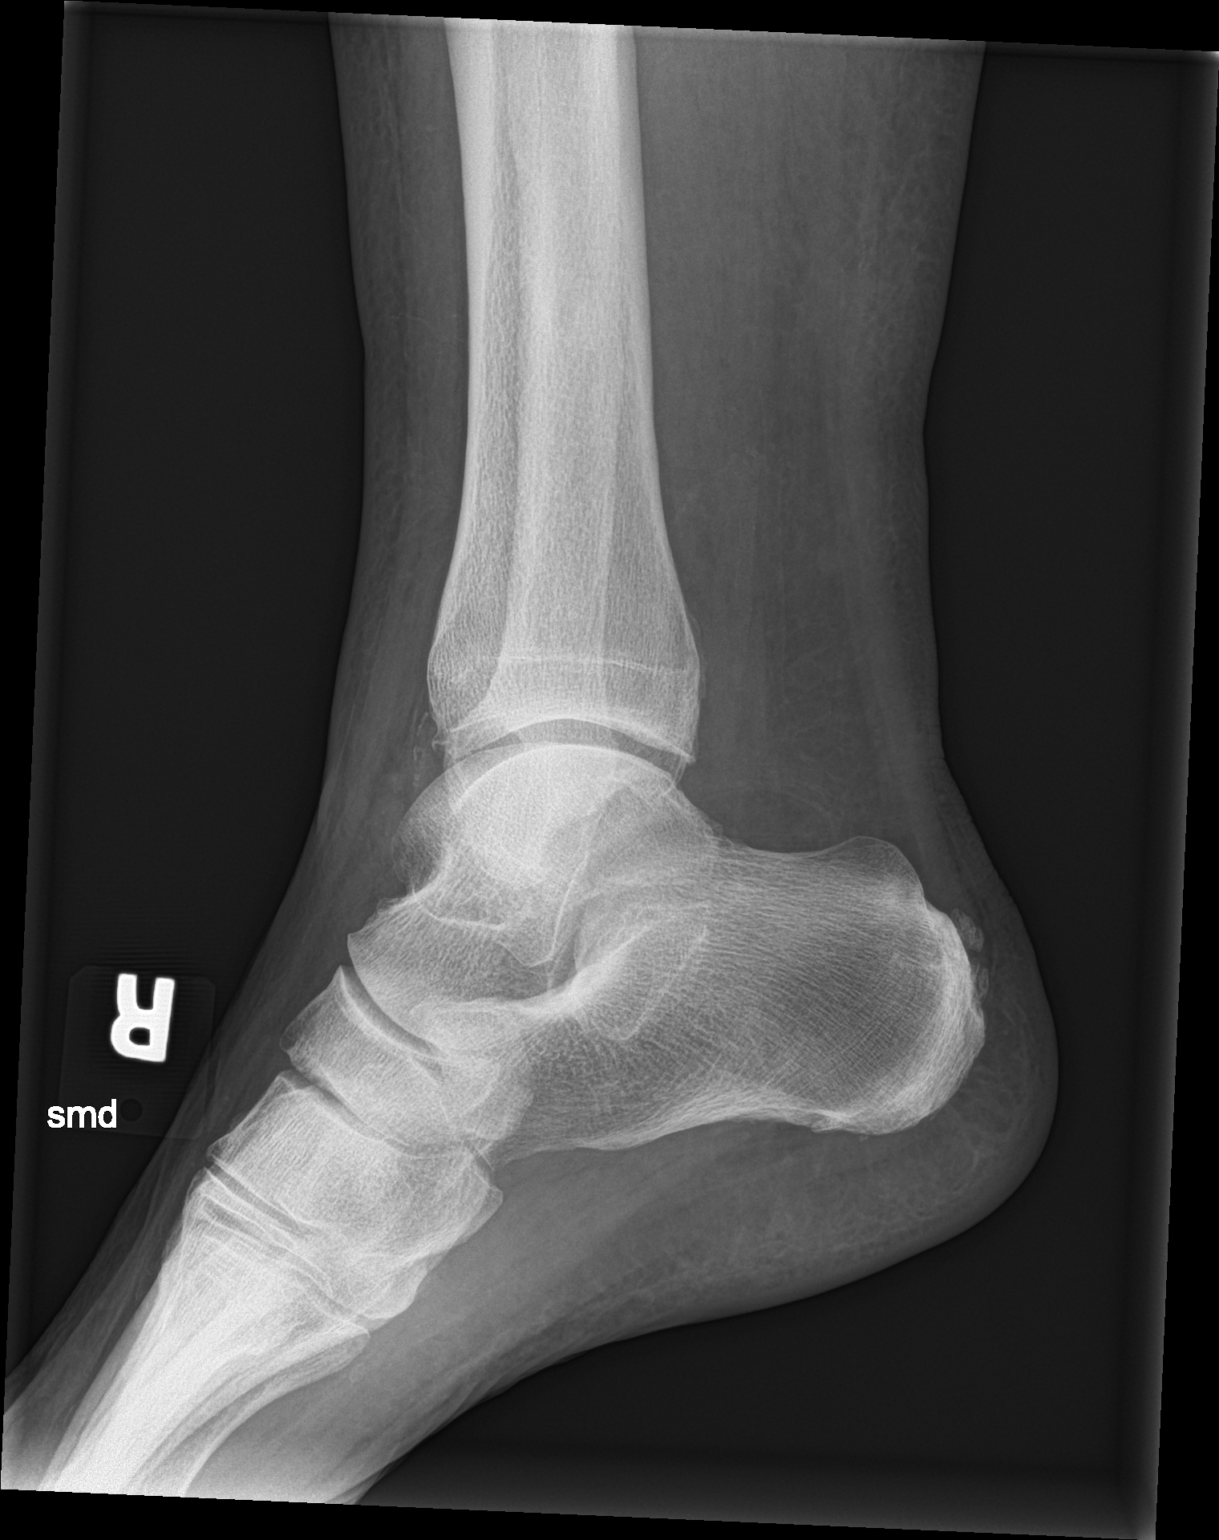

[3 of 3 positions shown; findings below may reference images not displayed]

FINDINGS: Mild-to-moderate diffuse soft tissue swelling. Talotibial joint
degenerative changes with degenerative spur formation between the
lateral malleolus and adjacent talus as well. Mild posterior
calcaneal spur formation. No fracture, dislocation or effusion.
IMPRESSION: 1. Soft tissue swelling without fracture.
2. Degenerative changes.

## 2018-09-07 ENCOUNTER — Other Ambulatory Visit: Payer: Self-pay

## 2018-09-07 ENCOUNTER — Emergency Department: Payer: Medicare Other

## 2018-09-07 DIAGNOSIS — G5600 Carpal tunnel syndrome, unspecified upper limb: Secondary | ICD-10-CM | POA: Diagnosis not present

## 2018-09-07 DIAGNOSIS — Z8249 Family history of ischemic heart disease and other diseases of the circulatory system: Secondary | ICD-10-CM | POA: Insufficient documentation

## 2018-09-07 DIAGNOSIS — S32019A Unspecified fracture of first lumbar vertebra, initial encounter for closed fracture: Principal | ICD-10-CM | POA: Insufficient documentation

## 2018-09-07 DIAGNOSIS — S33101A Dislocation of unspecified lumbar vertebra, initial encounter: Secondary | ICD-10-CM | POA: Insufficient documentation

## 2018-09-07 DIAGNOSIS — M545 Low back pain: Secondary | ICD-10-CM | POA: Diagnosis not present

## 2018-09-07 DIAGNOSIS — Z7982 Long term (current) use of aspirin: Secondary | ICD-10-CM | POA: Insufficient documentation

## 2018-09-07 DIAGNOSIS — I4891 Unspecified atrial fibrillation: Secondary | ICD-10-CM | POA: Insufficient documentation

## 2018-09-07 DIAGNOSIS — R7303 Prediabetes: Secondary | ICD-10-CM | POA: Insufficient documentation

## 2018-09-07 DIAGNOSIS — W010XXA Fall on same level from slipping, tripping and stumbling without subsequent striking against object, initial encounter: Secondary | ICD-10-CM | POA: Diagnosis not present

## 2018-09-07 DIAGNOSIS — Z79899 Other long term (current) drug therapy: Secondary | ICD-10-CM | POA: Diagnosis not present

## 2018-09-07 DIAGNOSIS — S32029A Unspecified fracture of second lumbar vertebra, initial encounter for closed fracture: Secondary | ICD-10-CM | POA: Diagnosis not present

## 2018-09-07 DIAGNOSIS — R2689 Other abnormalities of gait and mobility: Secondary | ICD-10-CM | POA: Insufficient documentation

## 2018-09-07 DIAGNOSIS — R296 Repeated falls: Secondary | ICD-10-CM | POA: Insufficient documentation

## 2018-09-07 DIAGNOSIS — Z87891 Personal history of nicotine dependence: Secondary | ICD-10-CM | POA: Diagnosis not present

## 2018-09-07 DIAGNOSIS — M353 Polymyalgia rheumatica: Secondary | ICD-10-CM | POA: Diagnosis not present

## 2018-09-07 DIAGNOSIS — E785 Hyperlipidemia, unspecified: Secondary | ICD-10-CM | POA: Insufficient documentation

## 2018-09-07 DIAGNOSIS — I10 Essential (primary) hypertension: Secondary | ICD-10-CM | POA: Insufficient documentation

## 2018-09-07 DIAGNOSIS — R52 Pain, unspecified: Secondary | ICD-10-CM | POA: Diagnosis present

## 2018-09-07 DIAGNOSIS — Y93K1 Activity, walking an animal: Secondary | ICD-10-CM | POA: Diagnosis not present

## 2018-09-07 DIAGNOSIS — R2681 Unsteadiness on feet: Secondary | ICD-10-CM | POA: Diagnosis not present

## 2018-09-07 DIAGNOSIS — G629 Polyneuropathy, unspecified: Secondary | ICD-10-CM | POA: Insufficient documentation

## 2018-09-07 DIAGNOSIS — Z950 Presence of cardiac pacemaker: Secondary | ICD-10-CM | POA: Insufficient documentation

## 2018-09-07 DIAGNOSIS — S32009A Unspecified fracture of unspecified lumbar vertebra, initial encounter for closed fracture: Secondary | ICD-10-CM | POA: Diagnosis present

## 2018-09-07 NOTE — ED Triage Notes (Signed)
Reports tripped over brick going up steps and landed on large flower pot.  States he sat down for awhile then was unable to get up.  Reports pain on left side of ribs.

## 2018-09-07 NOTE — ED Notes (Addendum)
Per EMS, BP 121/74, 91, R18, 94% room air. Hx: COPD, Afib. Had Watchman's Device placed in heart back in July. Describes device as a filter type device. Walking his dog, tripped and fell. Three falls over the last three weeks, dog caused this fall only. Has bruising to lower back. No lacerations noted.

## 2018-09-08 ENCOUNTER — Emergency Department: Payer: Medicare Other

## 2018-09-08 ENCOUNTER — Encounter: Payer: Self-pay | Admitting: Radiology

## 2018-09-08 ENCOUNTER — Observation Stay
Admission: EM | Admit: 2018-09-08 | Discharge: 2018-09-09 | Disposition: A | Discharge status: Home / Self Care | Payer: Medicare Other | Attending: Internal Medicine | Admitting: Internal Medicine

## 2018-09-08 DIAGNOSIS — R52 Pain, unspecified: Secondary | ICD-10-CM

## 2018-09-08 DIAGNOSIS — S32009A Unspecified fracture of unspecified lumbar vertebra, initial encounter for closed fracture: Secondary | ICD-10-CM

## 2018-09-08 DIAGNOSIS — M549 Dorsalgia, unspecified: Secondary | ICD-10-CM | POA: Diagnosis present

## 2018-09-08 DIAGNOSIS — S32019A Unspecified fracture of first lumbar vertebra, initial encounter for closed fracture: Secondary | ICD-10-CM | POA: Diagnosis not present

## 2018-09-08 HISTORY — DX: Sleep apnea, unspecified: G47.30

## 2018-09-08 HISTORY — DX: Presence of cardiac pacemaker: Z95.0

## 2018-09-08 LAB — COMPREHENSIVE METABOLIC PANEL
ALK PHOS: 118 U/L (ref 38–126)
ALT: 15 U/L (ref 0–44)
AST: 26 U/L (ref 15–41)
Albumin: 3.9 g/dL (ref 3.5–5.0)
Anion gap: 9 (ref 5–15)
BILIRUBIN TOTAL: 0.7 mg/dL (ref 0.3–1.2)
BUN: 30 mg/dL — AB (ref 8–23)
CALCIUM: 8.8 mg/dL — AB (ref 8.9–10.3)
CO2: 27 mmol/L (ref 22–32)
Chloride: 103 mmol/L (ref 98–111)
Creatinine, Ser: 0.95 mg/dL (ref 0.61–1.24)
GFR calc Af Amer: >60 mL/min (ref >60)
Glucose, Bld: 170 mg/dL — ABNORMAL HIGH (ref 70–99)
POTASSIUM: 4.4 mmol/L (ref 3.5–5.1)
Sodium: 139 mmol/L (ref 135–145)
TOTAL PROTEIN: 7 g/dL (ref 6.5–8.1)

## 2018-09-08 LAB — CBC WITH DIFFERENTIAL/PLATELET
ABS IMMATURE GRANULOCYTES: 0.04 10*3/uL (ref 0.00–0.07)
BASOS PCT: 0 %
Basophils Absolute: 0 10*3/uL (ref 0.0–0.1)
EOS ABS: 0 10*3/uL (ref 0.0–0.5)
Eosinophils Relative: 0 %
HCT: 34.8 % — ABNORMAL LOW (ref 39.0–52.0)
Hemoglobin: 11.7 g/dL — ABNORMAL LOW (ref 13.0–17.0)
Immature Granulocytes: 0 %
Lymphocytes Relative: 14 %
Lymphs Abs: 1.4 10*3/uL (ref 0.7–4.0)
MCH: 31.8 pg (ref 26.0–34.0)
MCHC: 33.6 g/dL (ref 30.0–36.0)
MCV: 94.6 fL (ref 80.0–100.0)
MONO ABS: 0.6 10*3/uL (ref 0.1–1.0)
Monocytes Relative: 6 %
NEUTROS ABS: 8.2 10*3/uL — AB (ref 1.7–7.7)
Neutrophils Relative %: 80 %
Platelets: 243 10*3/uL (ref 150–400)
RBC: 3.68 MIL/uL — AB (ref 4.22–5.81)
RDW: 12.9 % (ref 11.5–15.5)
WBC: 10.3 10*3/uL (ref 4.0–10.5)
nRBC: 0 % (ref 0.0–0.2)

## 2018-09-08 MED ORDER — ACETAMINOPHEN 325 MG PO TABS: 650.0000 mg | ORAL_TABLET | Freq: Four times a day (QID) | ORAL | Status: DC | PRN

## 2018-09-08 MED ORDER — FUROSEMIDE 40 MG PO TABS: 40.0000 mg | ORAL_TABLET | ORAL | Status: DC

## 2018-09-08 MED ORDER — OXYCODONE HCL 5 MG PO TABS
5.0000 mg | ORAL_TABLET | ORAL | Status: DC | PRN
  Administered 2018-09-08 – 2018-09-09 (×4): 5 mg via ORAL
  Filled 2018-09-08 (×4): qty 1

## 2018-09-08 MED ORDER — DICLOFENAC SODIUM 1 % TD GEL
1.0000 "application " | TRANSDERMAL | Status: DC | PRN
  Filled 2018-09-08: qty 100

## 2018-09-08 MED ORDER — OMEGA-3-ACID ETHYL ESTERS 1 G PO CAPS
2.0000 g | ORAL_CAPSULE | Freq: Every day | ORAL | Status: DC
  Filled 2018-09-08: qty 2

## 2018-09-08 MED ORDER — ENOXAPARIN SODIUM 40 MG/0.4ML ~~LOC~~ SOLN: 40.0000 mg | SUBCUTANEOUS | Status: DC

## 2018-09-08 MED ORDER — FENTANYL CITRATE (PF) 100 MCG/2ML IJ SOLN
100.0000 ug | Freq: Once | INTRAMUSCULAR | Status: AC
  Administered 2018-09-08: 100 ug via INTRAVENOUS
  Filled 2018-09-08: qty 2

## 2018-09-08 MED ORDER — ONDANSETRON HCL 4 MG/2ML IJ SOLN
4.0000 mg | Freq: Once | INTRAMUSCULAR | Status: AC
  Administered 2018-09-08: 4 mg via INTRAVENOUS
  Filled 2018-09-08: qty 2

## 2018-09-08 MED ORDER — PSYLLIUM 95 % PO PACK
1.0000 | PACK | Freq: Every day | ORAL | Status: DC
  Administered 2018-09-08 – 2018-09-09 (×2): 1 via ORAL
  Filled 2018-09-08 (×2): qty 1

## 2018-09-08 MED ORDER — CYCLOBENZAPRINE HCL 10 MG PO TABS
10.0000 mg | ORAL_TABLET | Freq: Three times a day (TID) | ORAL | Status: DC | PRN
  Administered 2018-09-08 (×2): 10 mg via ORAL
  Filled 2018-09-08 (×2): qty 1

## 2018-09-08 MED ORDER — ACETAMINOPHEN 650 MG RE SUPP: 650.0000 mg | Freq: Four times a day (QID) | RECTAL | Status: DC | PRN

## 2018-09-08 MED ORDER — ROSUVASTATIN CALCIUM 5 MG PO TABS
5.0000 mg | ORAL_TABLET | Freq: Every day | ORAL | Status: DC
  Administered 2018-09-08 – 2018-09-09 (×2): 5 mg via ORAL
  Filled 2018-09-08 (×2): qty 1

## 2018-09-08 MED ORDER — COLCHICINE 0.6 MG PO TABS
0.6000 mg | ORAL_TABLET | Freq: Every day | ORAL | Status: DC
  Filled 2018-09-08: qty 1

## 2018-09-08 MED ORDER — LORATADINE 10 MG PO TABS
10.0000 mg | ORAL_TABLET | Freq: Every day | ORAL | Status: DC
  Filled 2018-09-08: qty 1

## 2018-09-08 MED ORDER — ONDANSETRON HCL 4 MG PO TABS: 4.0000 mg | ORAL_TABLET | Freq: Four times a day (QID) | ORAL | Status: DC | PRN

## 2018-09-08 MED ORDER — OXYCODONE-ACETAMINOPHEN 5-325 MG PO TABS
1.0000 | ORAL_TABLET | Freq: Four times a day (QID) | ORAL | Status: DC | PRN
  Administered 2018-09-09 (×3): 1 via ORAL
  Filled 2018-09-08 (×3): qty 1

## 2018-09-08 MED ORDER — DOCUSATE SODIUM 100 MG PO CAPS
100.0000 mg | ORAL_CAPSULE | Freq: Two times a day (BID) | ORAL | Status: DC
  Administered 2018-09-08 – 2018-09-09 (×3): 100 mg via ORAL
  Filled 2018-09-08 (×3): qty 1

## 2018-09-08 MED ORDER — TIOTROPIUM BROMIDE MONOHYDRATE 18 MCG IN CAPS
1.0000 | ORAL_CAPSULE | Freq: Every day | RESPIRATORY_TRACT | Status: DC
  Administered 2018-09-09: 18 ug via RESPIRATORY_TRACT
  Filled 2018-09-08: qty 5

## 2018-09-08 MED ORDER — KETOROLAC TROMETHAMINE 30 MG/ML IJ SOLN
15.0000 mg | Freq: Once | INTRAMUSCULAR | Status: AC
  Administered 2018-09-08: 15 mg via INTRAVENOUS
  Filled 2018-09-08: qty 1

## 2018-09-08 MED ORDER — IOPAMIDOL (ISOVUE-370) INJECTION 76%
125.0000 mL | Freq: Once | INTRAVENOUS | Status: AC | PRN
  Administered 2018-09-08: 125 mL via INTRAVENOUS

## 2018-09-08 MED ORDER — ONDANSETRON HCL 4 MG/2ML IJ SOLN: 4.0000 mg | Freq: Four times a day (QID) | INTRAMUSCULAR | Status: DC | PRN

## 2018-09-08 MED ORDER — GABAPENTIN 300 MG PO CAPS
300.0000 mg | ORAL_CAPSULE | Freq: Two times a day (BID) | ORAL | Status: DC
  Filled 2018-09-08 (×2): qty 1

## 2018-09-08 MED ORDER — KETOROLAC TROMETHAMINE 30 MG/ML IJ SOLN
30.0000 mg | Freq: Four times a day (QID) | INTRAMUSCULAR | Status: DC | PRN
  Administered 2018-09-08: 30 mg via INTRAVENOUS
  Filled 2018-09-08 (×2): qty 1

## 2018-09-08 MED ORDER — OCUVITE-LUTEIN PO CAPS
1.0000 | ORAL_CAPSULE | Freq: Every day | ORAL | Status: DC
  Administered 2018-09-09: 1 via ORAL
  Filled 2018-09-08: qty 1

## 2018-09-08 NOTE — Care Management Note (Signed)
Case Management Note  Patient Details  Name: [name] [name] MRN: [id number] Date of Birth: [birth year]  Subjective/Objective:     Patient is being placed in observation status after a fall and having resulting severe right side pain.  Patient is from home with wife, independent in ADL's, he does have a cane but does not use it.  Patient has had frequent falls per himself and his wife and daughter over the past few months.  Patient reports that he just trips and does not recover well.  Patient drives.  PCP verified as Dr. Zada Finders and pharmacy is [facility].  Patient has [employer] care insurance but is not a patient of the [place].  MOON letter reviewed signed and copy given.  No current needs identified waiting on PT and neuro consult.  RNCM will cont to follow. Robbie Lis RN BSN            Action/Plan:   Expected Discharge Date:                  Expected Discharge Plan:  Home/Self Care  In-House Referral:     Discharge planning Services  CM Consult  Post Acute Care Choice:    Choice offered to:     DME Arranged:    DME Agency:     HH Arranged:    HH Agency:     Status of Service:  In process, will continue to follow  If discussed at Long Length of Stay Meetings, dates discussed:    Additional Comments:  Allayne Butcher, RN 09/08/2018, 3:39 PM

## 2018-09-08 NOTE — Progress Notes (Signed)
[facility] [place] at [facility]   PATIENT NAME: [name] [name]    MR#:  [id number]  DATE OF BIRTH:  07/29/1932  SUBJECTIVE:  CHIEF COMPLAINT: pt and his family were very frustrated as patient was not given any pain medications or food until 11 AM.  Would like to know what PT recommendations are and PT is waiting for neurosurgery consult and recommendations.  During my examination pain was 2-3 out of 10 Daughter and wife are at bedside  REVIEW OF SYSTEMS:  CONSTITUTIONAL: No fever, fatigue or weakness.  EYES: No blurred or double vision.  EARS, NOSE, AND THROAT: No tinnitus or ear pain.  RESPIRATORY: No cough, shortness of breath, wheezing or hemoptysis.  CARDIOVASCULAR: No chest pain, orthopnea, edema.  GASTROINTESTINAL: No nausea, vomiting, diarrhea or abdominal pain.  GENITOURINARY: No dysuria, hematuria.  ENDOCRINE: No polyuria, nocturia,  HEMATOLOGY: No anemia, easy bruising or bleeding SKIN: No rash or lesion. MUSCULOSKELETAL: Back pain NEUROLOGIC: No tingling, numbness, weakness.  PSYCHIATRY: No anxiety or depression.   DRUG ALLERGIES:  No Known Allergies  VITALS:  Blood pressure (!) 101/52, pulse 76, temperature 97.9 F (36.6 C), temperature source Oral, resp. rate 18, height 6\' 2"  (1.88 m), weight 107.5 kg, SpO2 96 %.  PHYSICAL EXAMINATION:  GENERAL:  85 y.o.-year-old patient lying in the bed with no acute distress.  EYES: Pupils equal, round, reactive to light and accommodation. No scleral icterus. Extraocular muscles intact.  HEENT: Head atraumatic, normocephalic. Oropharynx and nasopharynx clear.  NECK:  Supple, no jugular venous distention. No thyroid enlargement, no tenderness.  LUNGS: Normal breath sounds bilaterally, no wheezing, rales,rhonchi or crepitation. No use of accessory muscles of respiration.  CARDIOVASCULAR: S1, S2 normal. No murmurs, rubs, or gallops.  ABDOMEN: Soft, nontender, nondistended. Bowel sounds present.   EXTREMITIES: No pedal edema, cyanosis, or clubbing.  Lower back is tender NEUROLOGIC: Awake alert and oriented x3. Sensation intact. Gait not checked.  PSYCHIATRIC: The patient is alert and oriented x 3.  SKIN: No obvious rash, lesion, or ulcer.    LABORATORY PANEL:   CBC Recent Labs  Lab 09/08/18 0316  WBC 10.3  HGB 11.7*  HCT 34.8*  PLT 243   ------------------------------------------------------------------------------------------------------------------  Chemistries  Recent Labs  Lab 09/08/18 0316  NA 139  K 4.4  CL 103  CO2 27  GLUCOSE 170*  BUN 30*  CREATININE 0.95  CALCIUM 8.8*  AST 26  ALT 15  ALKPHOS 118  BILITOT 0.7   ------------------------------------------------------------------------------------------------------------------  Cardiac Enzymes No results for input(s): TROPONINI in the last 168 hours. ------------------------------------------------------------------------------------------------------------------  RADIOLOGY:  Dg Ribs Unilateral W/chest Right  Result Date: 09/07/2018 CLINICAL DATA:  Fall, right posterior rib pain EXAM: RIGHT RIBS AND CHEST - 3+ VIEW COMPARISON:  None. FINDINGS: Lungs are clear.  No pleural effusion or pneumothorax. The heart is normal in size. Right chest pacemaker. Thoracic aortic atherosclerosis. No displaced right rib fracture is seen. IMPRESSION: No evidence of acute cardiopulmonary disease. No displaced right rib fracture is seen. Electronically Signed   By: Charline BillsSriyesh  Krishnan M.D.   On: 09/07/2018 23:15   Ct Chest W Contrast  Result Date: 09/08/2018 CLINICAL DATA:  Trip and fall injury. Left-sided rib pain. EXAM: CT CHEST, ABDOMEN, AND PELVIS WITH CONTRAST TECHNIQUE: Multidetector CT imaging of the chest, abdomen and pelvis was performed following the standard protocol during bolus administration of intravenous contrast. CONTRAST:  125mL ISOVUE-370 IOPAMIDOL (ISOVUE-370) INJECTION 76% COMPARISON:  None. FINDINGS:  CT CHEST FINDINGS Cardiovascular: Normal heart size. No  pericardial effusions. Coronary artery calcifications. Normal caliber thoracic aorta. Scattered aortic calcifications. Aberrant right subclavian vein. Cardiac pacemaker. Mediastinum/Nodes: No significant lymphadenopathy. Esophagus is decompressed. Lungs/Pleura: Emphysematous changes with mild peripheral fibrosis in the lungs. No airspace disease or consolidation. No pleural effusions. No pneumothorax. Musculoskeletal: Degenerative changes in the thoracic spine. No vertebral compression deformities. Normal alignment. Sternum appears intact. No acute depressed rib fractures identified. CT ABDOMEN PELVIS FINDINGS Hepatobiliary: Cholelithiasis with small stone in the gallbladder. No other inflammatory changes. No bile duct dilatation. No focal liver lesions. No liver laceration. Pancreas: Unremarkable. No pancreatic ductal dilatation or surrounding inflammatory changes. Spleen: Normal in size without focal abnormality. Adrenals/Urinary Tract: No adrenal hemorrhage or renal injury identified. Bladder is unremarkable. Stomach/Bowel: Stomach is within normal limits. Appendix appears normal. No evidence of bowel wall thickening, distention, or inflammatory changes. Diverticulosis of the colon without evidence of diverticulitis. Vascular/Lymphatic: Calcification of the aorta. No aneurysm. No significant lymphadenopathy. Reproductive: Prostate is unremarkable. Other: No free air or free fluid in the abdomen. Musculoskeletal: Degenerative changes in the lumbar spine. No vertebral compression deformities. There are mildly displaced fractures of the right transverse processes at L1 and L2. There is a hematoma measuring 4.5 x 12.4 cm in the posterior paraspinal musculature at the level of the right eleventh and twelfth ribs. No active contrast extravasation. Edema or bruising in the adjacent subcutaneous fat. Pelvis, sacrum, and hips appear intact. IMPRESSION: 1. Mildly  displaced fractures of the right transverse processes at L1 and L2. 2. Hematoma in the posterior paraspinal musculature at the level of the right eleventh and twelfth ribs. No active contrast extravasation. 3. No evidence of mediastinal or pulmonary parenchymal injury. 4. No evidence of solid organ injury or bowel perforation. 5. Cholelithiasis. Electronically Signed   By: Burman Nieves M.D.   On: 09/08/2018 03:54   Ct Abdomen Pelvis W Contrast  Result Date: 09/08/2018 CLINICAL DATA:  Trip and fall injury. Left-sided rib pain. EXAM: CT CHEST, ABDOMEN, AND PELVIS WITH CONTRAST TECHNIQUE: Multidetector CT imaging of the chest, abdomen and pelvis was performed following the standard protocol during bolus administration of intravenous contrast. CONTRAST:  ISOVUE-370 IOPAMIDOL (ISOVUE-370) INJECTION 76% COMPARISON:  None. FINDINGS: CT CHEST FINDINGS Cardiovascular: Normal heart size. No pericardial effusions. Coronary artery calcifications. Normal caliber thoracic aorta. Scattered aortic calcifications. Aberrant right subclavian vein. Cardiac pacemaker. Mediastinum/Nodes: No significant lymphadenopathy. Esophagus is decompressed. Lungs/Pleura: Emphysematous changes with mild peripheral fibrosis in the lungs. No airspace disease or consolidation. No pleural effusions. No pneumothorax. Musculoskeletal: Degenerative changes in the thoracic spine. No vertebral compression deformities. Normal alignment. Sternum appears intact. No acute depressed rib fractures identified. CT ABDOMEN PELVIS FINDINGS Hepatobiliary: Cholelithiasis with small stone in the gallbladder. No other inflammatory changes. No bile duct dilatation. No focal liver lesions. No liver laceration. Pancreas: Unremarkable. No pancreatic ductal dilatation or surrounding inflammatory changes. Spleen: Normal in size without focal abnormality. Adrenals/Urinary Tract: No adrenal hemorrhage or renal injury identified. Bladder is unremarkable.  Stomach/Bowel: Stomach is within normal limits. Appendix appears normal. No evidence of bowel wall thickening, distention, or inflammatory changes. Diverticulosis of the colon without evidence of diverticulitis. Vascular/Lymphatic: Calcification of the aorta. No aneurysm. No significant lymphadenopathy. Reproductive: Prostate is unremarkable. Other: No free air or free fluid in the abdomen. Musculoskeletal: Degenerative changes in the lumbar spine. No vertebral compression deformities. There are mildly displaced fractures of the right transverse processes at L1 and L2. There is a hematoma measuring 4.5 x 12.4 cm in the posterior paraspinal musculature at the level  of the right eleventh and twelfth ribs. No active contrast extravasation. Edema or bruising in the adjacent subcutaneous fat. Pelvis, sacrum, and hips appear intact. IMPRESSION: 1. Mildly displaced fractures of the right transverse processes at L1 and L2. 2. Hematoma in the posterior paraspinal musculature at the level of the right eleventh and twelfth ribs. No active contrast extravasation. 3. No evidence of mediastinal or pulmonary parenchymal injury. 4. No evidence of solid organ injury or bowel perforation. 5. Cholelithiasis. Electronically Signed   By: Burman Nieves M.D.   On: 09/08/2018 03:54    EKG:   Orders placed or performed in visit on 05/01/11  . EKG 12-Lead    ASSESSMENT AND PLAN:   This is an 85 year old male admitted for back pain. 1.  Acute back pain secondary to displaced transverse process of L1 and L2 on the right side  Pain management as needed.  Patient is refusing scheduled pain meds Toradol 30 mg IV every 6 hours and oxycodone as needed  Continue muscle relaxants as needed Neurosurgery consult placed to Dr. Adriana Simas by me and secured text message sent via haiku  Physical therapy  and occupational therapy eval pending-discussed with physical therapist, they will evaluate the patient after neurosurgery sees the patient  based on the recommendations  2.  Atrial fibrillation: Rate controlled; continue metoprolol.  Continue aspirin.  3.  Hypertension: Controlled; continue to monitor.  4.  Carpal tunnel syndrome: Stable.  5.  Hyperlipidemia: Continue statin therapy  6.  DVT prophylaxis: Lovenox 7.  GI prophylaxis: None     All the records are reviewed and case discussed with Care Management/Social Workerr. Management plans discussed with the patient, family and all their quesitons answered to their satisfaction  ED RN Carollee Herter was present during the conversation  CODE STATUS: fc   TOTAL TIME TAKING CARE OF THIS PATIENT: 35  minutes.   POSSIBLE D/C IN 1-2 DAYS, DEPENDING ON CLINICAL CONDITION.  Note: This dictation was prepared with Dragon dictation along with smaller phrase technology. Any transcriptional errors that result from this process are unintentional.   Ramonita Lab M.D on 09/08/2018 at 1:26 PM  Between 7am to 6pm - Pager - [phone] After 6pm go to [email or web address] - password [email or web address] [facility]  [place] [facility]  Office  [phone]  CC: Primary care physician; Dione Housekeeper, MD

## 2018-09-08 NOTE — Progress Notes (Signed)
PT Cancellation Note  Patient Details Name: [name] [name] MRN: [id number] DOB: [birth year]   Cancelled Treatment:    Reason Eval/Treat Not Completed: Medical issues which prohibited therapy(Consult received and chart reviewed.  Per discussion with attending, to order neurosurgery consult and consider for TLSO bracing.  Will hold PT eval until consult complete and appropriate mobility recommendations in place.)   Sasha Rueth H. Manson Passey, PT, DPT, NCS 09/08/18, 1:47 PM [phone]

## 2018-09-08 NOTE — ED Notes (Signed)
Pt states he does feel nauseous but denies emesis, denies and visual changes or LOC. Pt able to stand and pivot onto stretcher from wheelchair. Pain increases with movement, but appears to maintain full ROM of extremities

## 2018-09-08 NOTE — ED Provider Notes (Signed)
[facility] Emergency Department Provider Note  ____________________________________________   First MD Initiated Contact with Patient 09/08/18 0239     (approximate)  I have reviewed the triage vital signs and the nursing notes.   HISTORY  Chief Complaint Fall   HPI [name] [name] is a 85 y.o. male who comes to the emergency department via EMS with sudden onset severe right low back/right flank pain that began when he was walking his dog and tripped over a curb falling onto his right side.  His pain is sudden onset severe nonradiating worse with deep inspiration and somewhat improved when lying still.  He did not hit his head.  No loss of consciousness.  No neck pain.  No numbness or weakness.  No abdominal pain nausea or vomiting.  He takes a baby aspirin a day and no other blood thinning medication.    Past Medical History:  Diagnosis Date  . Atrial fibrillation (HCC)   . Atrial fibrillation (HCC)   . GIB (gastrointestinal bleeding)    With colonic AVMs S/P coiling  . Hyperlipidemia   . Hypertension   . Metabolic syndrome   . Paroxysmal supraventricular tachycardia (HCC)   . PMR (polymyalgia rheumatica) (HCC)   . Sick sinus syndrome with tachycardia (HCC)   . Tachycardia     Patient Active Problem List   Diagnosis Date Noted  . EDEMA 12/07/2009  . METABOLIC SYNDROME X 01/14/2009  . HYPERLIPIDEMIA 12/16/2008  . ATRIAL FIBRILLATION 12/16/2008    Past Surgical History:  Procedure Laterality Date  . CARPAL TUNNEL RELEASE  1982   Right  . CARPAL TUNNEL RELEASE  2000   Left  . COLONOSCOPY WITH PROPOFOL N/A 06/18/2016   Procedure: COLONOSCOPY WITH PROPOFOL;  Surgeon: Scot Junobert T Elliott, MD;  [facility];  Service: Endoscopy;  Laterality: N/A;  . 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 KNEE SURGERY   1997   Left    Prior to Admission medications   Medication Sig Start Date End Date Taking? Authorizing Provider  aspirin 325 MG tablet Take 325 mg by mouth daily.      [provider]  fish oil-omega-3 fatty acids 1000 MG capsule Take 2 g by mouth daily.      [provider]  furosemide (LASIX) 40 MG tablet Take one tablet on Monday, Wednesday and Friday. 01/15/11   Bensimhon, Bevelyn Bucklesaniel R, MD  Lutein 6 MG CAPS Take 1 capsule by mouth daily.      [provider]  metoprolol (TOPROL-XL) 100 MG 24 hr tablet Take 1.5 tablets (150 mg total) by mouth daily. 05/01/11   Antonieta IbaGollan, Timothy J, MD  Multiple Vitamins-Minerals (CENTRUM SILVER PO) Take by mouth daily.      [provider]  omeprazole (PRILOSEC) 20 MG capsule Take 1 capsule (20 mg total) by mouth daily. 01/15/11   Bensimhon, Bevelyn Bucklesaniel R, MD  potassium chloride SA (K-DUR,KLOR-CON) 20 MEQ tablet Take one tablet on Mondays, Wednesday and Fridays. 01/15/11   Bensimhon, Bevelyn Bucklesaniel R, MD  predniSONE (DELTASONE) 20 MG tablet Take 1 tablet (20 mg total) by mouth daily with breakfast. Take 3 tablets on day 1-2, take 2 tablets on day 3-4, take 1 tablet on day 5-6. 07/14/16   Lucia EstelleZheng, Feng, NP  psyllium (METAMUCIL) 58.6 % powder Take 1 packet by mouth 3 (three) times daily.    [provider]  Psyllium-Calcium (METAMUCIL PLUS CALCIUM) CAPS Take 2 capsules by mouth daily.      [provider]  rosuvastatin (CRESTOR) 5 MG tablet Take 5 mg by mouth daily.    [provider]  simvastatin (ZOCOR) 20 MG tablet TAKE 1 TABLET BY MOUTH AT BEDTIME 06/09/12   Antonieta IbaGollan, Timothy J, MD    Allergies Patient has no known allergies.  Family History  Problem Relation Age of Onset  . Cancer Other   . Coronary artery disease Other   . Other Mother   . Other Father     Social History Social History   Tobacco Use  . Smoking status: Former Smoker    Last attempt to quit: 08/27/1974    Years since quitting: 44.0  .  Smokeless tobacco: Never Used  Substance Use Topics  . Alcohol use: No  . Drug use: No    Review of Systems Constitutional: No fever/chills Eyes: No visual changes. ENT: No sore throat. Cardiovascular: Denies chest pain. Respiratory: Denies shortness of breath. Gastrointestinal: No abdominal pain.  No nausea, no vomiting.  No diarrhea.  No constipation. Genitourinary: Negative for dysuria. Musculoskeletal: Positive for back pain. Skin: Negative for rash. Neurological: Negative for headaches, focal weakness or numbness.   ____________________________________________   PHYSICAL EXAM:  VITAL SIGNS: ED Triage Vitals  Enc Vitals Group     BP 09/07/18 2216 125/70     Pulse Rate 09/07/18 2216 85     Resp 09/07/18 2216 18     Temp 09/07/18 2216 97.9 F (36.6 C)     Temp Source 09/07/18 2216 Oral     SpO2 09/07/18 2216 97 %     Weight 09/07/18 2216 237 lb (107.5 kg)     Height 09/07/18 2216 6\' 2"  (1.88 m)     Head Circumference --      Peak Flow --      Pain Score 09/07/18 2215 7     Pain Loc --      Pain Edu? --      Excl. in GC? --     Constitutional: Alert and oriented x4 lying on his left side wincing and appears extremely uncomfortable Eyes: PERRL EOMI. Head: Atraumatic. Nose: No congestion/rhinnorhea. Mouth/Throat: No trismus Neck: No stridor.   Cardiovascular: Normal rate, regular rhythm. Grossly normal heart sounds.  Good peripheral circulation.  Chest wall stable no crepitus Respiratory: Normal respiratory effort.  No retractions. Lungs CTAB and moving good air Gastrointestinal: Soft nontender Musculoskeletal: Ecchymosis to right low back/flank with exquisite tenderness Neurologic:  Normal speech and language. No gross focal neurologic deficits are appreciated. Skin: Ecchymosis as above Psychiatric: Mood and affect are normal. Speech and behavior are normal.    ____________________________________________   DIFFERENTIAL includes but not limited to  Rib  fracture, rib contusion, pulmonary contusion, pneumothorax, hemothorax, liver laceration, spinal fracture ____________________________________________   LABS (all labs ordered are listed, but only abnormal results are displayed)  Labs Reviewed  COMPREHENSIVE METABOLIC PANEL - Abnormal; Notable for the following components:      Result Value   Glucose, Bld 170 (*)    BUN 30 (*)    Calcium 8.8 (*)    All other components within normal limits  CBC WITH DIFFERENTIAL/PLATELET - Abnormal; Notable for the following components:   RBC 3.68 (*)    Hemoglobin 11.7 (*)    HCT 34.8 (*)    Neutro Abs 8.2 (*)    All other components within normal limits    Lab work reviewed  by me with no acute disease noted aside from slightly elevated BUN possibly secondary to dehydration __________________________________________  EKG   ____________________________________________  RADIOLOGY  Chest x-ray reviewed by me with no acute disease CT chest abdomen pelvis reviewed by me shows L2 and L1 transverse process fractures on the right along with paraspinal hematoma although no active extravasation ____________________________________________   PROCEDURES  Procedure(s) performed: no  Procedures  Critical Care performed: no  ____________________________________________   INITIAL IMPRESSION / ASSESSMENT AND PLAN / ED COURSE  Pertinent labs & imaging results that were available during my care of the patient were reviewed by me and considered in my medical decision making (see chart for details).   As part of my medical decision making, I reviewed the following data within the electronic MEDICAL RECORD NUMBER History obtained from family if available, nursing notes, old chart and ekg, as well as notes from prior ED visits.  Patient comes to the emergency department extremely uncomfortable appearing fidgeting and unable to lie on his right side.  He obtained x-rays in triage which are negative however I  am concerned that he has an occult fracture or possibly liver laceration versus intra-abdominal injury given the location of his tenderness.  We will give him 100 mcg of IV fentanyl along with 4 mg of IV Zofran now and he will get a CT of his chest abdomen pelvis.    ----------------------------------------- 4:14 AM on 09/08/2018 -----------------------------------------  The patient has 2 displaced transverse process fractures of his lumbar spine and has required multiple doses of IV pain medication.  He is finding it difficulty to take a deep breath and I am concerned that if he were to go home he would develop atelectasis and pneumonia and decompensate.  He is neurologically intact.  At this point he requires inpatient admission for continued IV pain control, monitoring of his oxygen saturation, and while we do not have neurosurgery here tonight he does require neurosurgical consultation in the morning for likely TLSO brace.  ____________________________________________   FINAL CLINICAL IMPRESSION(S) / ED DIAGNOSES  Final diagnoses:  Closed fracture dislocation of lumbar spine, initial encounter (HCC)      NEW MEDICATIONS STARTED DURING THIS VISIT:  New Prescriptions   No medications on file     Note:  This document was prepared using Dragon voice recognition software and may include unintentional dictation errors.    Merrily Brittle, MD 09/08/18 [phone]

## 2018-09-08 NOTE — ED Notes (Signed)
Metformin, metoprolol, baby aspirin, lasix, eye, fish oil, iron taken at this time.

## 2018-09-08 NOTE — Care Management Obs Status (Signed)
MEDICARE OBSERVATION STATUS NOTIFICATION   Patient Details  Name: [name] [name] MRN: [id number] Date of Birth: 05-04-1932   Medicare Observation Status Notification Given:  Yes    Allayne Butcher, RN 09/08/2018, 3:24 PM

## 2018-09-08 NOTE — ED Notes (Signed)
Pt and family requesting transfer to [facility]. Admitting MD notified and reports they will start the process.

## 2018-09-08 NOTE — Progress Notes (Signed)
OT Cancellation Note  Patient Details Name: [name] [name] MRN: [id number] DOB: 28-Feb-1932   Cancelled Treatment:    Reason Eval/Treat Not Completed: Medical issues which prohibited therapy. Consult received and chart reviewed.  Per chart, MD to order neurosurgery consult and consider for TLSO bracing.  Will hold OT eval until consult complete and appropriate mobility/precaution recommendations in place.  Richrd Prime, MPH, MS, OTR/L ascom [phone] 09/08/18, 2:23 PM

## 2018-09-08 NOTE — ED Notes (Signed)
ED TO INPATIENT HANDOFF REPORT  Name/Age/Gender [name] [name] 85 y.o. male  Code Status    Code Status Orders  (From admission, onward)         Start     Ordered   09/08/18 1120  Full code  Continuous     09/08/18 1119        Code Status History    This patient has a current code status but no historical code status.    Advance Directive Documentation     Most Recent Value  Type of Advance Directive  Healthcare Power of Attorney, Living will  Pre-existing out of facility DNR order (yellow form or pink MOST form)  -  "MOST" Form in Place?  -      Home/SNF/Other Home  Chief Complaint fall  Level of Care/Admitting Diagnosis ED Disposition    ED Disposition Condition Comment   Admit  Hospital Area: [facility] [[id number]]  Level of Care: Med-Surg [16]  Diagnosis: Back pain [655374]  Admitting Physician: Arnaldo Natal [[id number]]  Attending Physician: Arnaldo Natal [phone]  PT Class (Do Not Modify): Observation [104]  PT Acc Code (Do Not Modify): Observation [10022]       Medical History Past Medical History:  Diagnosis Date  . Atrial fibrillation (HCC)   . Atrial fibrillation (HCC)   . GIB (gastrointestinal bleeding)    With colonic AVMs S/P coiling  . Hyperlipidemia   . Hypertension   . Metabolic syndrome   . Paroxysmal supraventricular tachycardia (HCC)   . PMR (polymyalgia rheumatica) (HCC)   . Sick sinus syndrome with tachycardia (HCC)   . Tachycardia     Allergies No Known Allergies  IV Location/Drains/Wounds Patient Lines/Drains/Airways Status   Active Line/Drains/Airways    Name:   Placement date:   Placement time:   Site:   Days:   Peripheral IV 09/08/18 Left Antecubital   09/08/18    0515    Antecubital   less than 1          Labs/Imaging Results for orders placed or performed during the hospital encounter of 09/08/18 (from the past 48 hour(s))  Comprehensive metabolic panel     Status:  Abnormal   Collection Time: 09/08/18  3:16 AM  Result Value Ref Range   Sodium 139 135 - 145 mmol/L   Potassium 4.4 3.5 - 5.1 mmol/L   Chloride 103 98 - 111 mmol/L   CO2 27 22 - 32 mmol/L   Glucose, Bld 170 (H) 70 - 99 mg/dL   BUN 30 (H) 8 - 23 mg/dL   Creatinine, Ser 1.00 0.61 - 1.24 mg/dL   Calcium 8.8 (L) 8.9 - 10.3 mg/dL   Total Protein 7.0 6.5 - 8.1 g/dL   Albumin 3.9 3.5 - 5.0 g/dL   AST 26 15 - 41 U/L   ALT 15 0 - 44 U/L   Alkaline Phosphatase 118 38 - 126 U/L   Total Bilirubin 0.7 0.3 - 1.2 mg/dL   GFR calc non Af Amer >60 >60 mL/min   GFR calc Af Amer >60 >60 mL/min   Anion gap 9 5 - 15    Comment: Performed at [facility], [address] [place], [place] [address]  CBC with Differential     Status: Abnormal   Collection Time: 09/08/18  3:16 AM  Result Value Ref Range   WBC 10.3 4.0 - 10.5 K/uL   RBC 3.68 (L) 4.22 - 5.81 MIL/uL   Hemoglobin 11.7 (  L) 13.0 - 17.0 g/dL   HCT 78.4 (L) 69.6 - 29.5 %   MCV 94.6 80.0 - 100.0 fL   MCH 31.8 26.0 - 34.0 pg   MCHC 33.6 30.0 - 36.0 g/dL   RDW 28.4 13.2 - 44.0 %   Platelets 243 150 - 400 K/uL   nRBC 0.0 0.0 - 0.2 %   Neutrophils Relative % 80 %   Neutro Abs 8.2 (H) 1.7 - 7.7 K/uL   Lymphocytes Relative 14 %   Lymphs Abs 1.4 0.7 - 4.0 K/uL   Monocytes Relative 6 %   Monocytes Absolute 0.6 0.1 - 1.0 K/uL   Eosinophils Relative 0 %   Eosinophils Absolute 0.0 0.0 - 0.5 K/uL   Basophils Relative 0 %   Basophils Absolute 0.0 0.0 - 0.1 K/uL   Immature Granulocytes 0 %   Abs Immature Granulocytes 0.04 0.00 - 0.07 K/uL    Comment: Performed at [facility], [address], [place], [place] [address]   Dg Ribs Unilateral W/chest Right  Result Date: 09/07/2018 CLINICAL DATA:  Fall, right posterior rib pain EXAM: RIGHT RIBS AND CHEST - 3+ VIEW COMPARISON:  None. FINDINGS: Lungs are clear.  No pleural effusion or pneumothorax. The heart is normal in size. Right chest pacemaker. Thoracic aortic atherosclerosis.  No displaced right rib fracture is seen. IMPRESSION: No evidence of acute cardiopulmonary disease. No displaced right rib fracture is seen. Electronically Signed   By: Charline Bills M.D.   On: 09/07/2018 23:15   Ct Chest W Contrast  Result Date: 09/08/2018 CLINICAL DATA:  Trip and fall injury. Left-sided rib pain. EXAM: CT CHEST, ABDOMEN, AND PELVIS WITH CONTRAST TECHNIQUE: Multidetector CT imaging of the chest, abdomen and pelvis was performed following the standard protocol during bolus administration of intravenous contrast. CONTRAST:  ISOVUE-370 IOPAMIDOL (ISOVUE-370) INJECTION 76% COMPARISON:  None. FINDINGS: CT CHEST FINDINGS Cardiovascular: Normal heart size. No pericardial effusions. Coronary artery calcifications. Normal caliber thoracic aorta. Scattered aortic calcifications. Aberrant right subclavian vein. Cardiac pacemaker. Mediastinum/Nodes: No significant lymphadenopathy. Esophagus is decompressed. Lungs/Pleura: Emphysematous changes with mild peripheral fibrosis in the lungs. No airspace disease or consolidation. No pleural effusions. No pneumothorax. Musculoskeletal: Degenerative changes in the thoracic spine. No vertebral compression deformities. Normal alignment. Sternum appears intact. No acute depressed rib fractures identified. CT ABDOMEN PELVIS FINDINGS Hepatobiliary: Cholelithiasis with small stone in the gallbladder. No other inflammatory changes. No bile duct dilatation. No focal liver lesions. No liver laceration. Pancreas: Unremarkable. No pancreatic ductal dilatation or surrounding inflammatory changes. Spleen: Normal in size without focal abnormality. Adrenals/Urinary Tract: No adrenal hemorrhage or renal injury identified. Bladder is unremarkable. Stomach/Bowel: Stomach is within normal limits. Appendix appears normal. No evidence of bowel wall thickening, distention, or inflammatory changes. Diverticulosis of the colon without evidence of diverticulitis.  Vascular/Lymphatic: Calcification of the aorta. No aneurysm. No significant lymphadenopathy. Reproductive: Prostate is unremarkable. Other: No free air or free fluid in the abdomen. Musculoskeletal: Degenerative changes in the lumbar spine. No vertebral compression deformities. There are mildly displaced fractures of the right transverse processes at L1 and L2. There is a hematoma measuring 4.5 x 12.4 cm in the posterior paraspinal musculature at the level of the right eleventh and twelfth ribs. No active contrast extravasation. Edema or bruising in the adjacent subcutaneous fat. Pelvis, sacrum, and hips appear intact. IMPRESSION: 1. Mildly displaced fractures of the right transverse processes at L1 and L2. 2. Hematoma in the posterior paraspinal musculature at the level of the right eleventh and  twelfth ribs. No active contrast extravasation. 3. No evidence of mediastinal or pulmonary parenchymal injury. 4. No evidence of solid organ injury or bowel perforation. 5. Cholelithiasis. Electronically Signed   By: Burman NievesWilliam  Stevens M.D.   On: 09/08/2018 03:54   Ct Abdomen Pelvis W Contrast  Result Date: 09/08/2018 CLINICAL DATA:  Trip and fall injury. Left-sided rib pain. EXAM: CT CHEST, ABDOMEN, AND PELVIS WITH CONTRAST TECHNIQUE: Multidetector CT imaging of the chest, abdomen and pelvis was performed following the standard protocol during bolus administration of intravenous contrast. CONTRAST:  125mL ISOVUE-370 IOPAMIDOL (ISOVUE-370) INJECTION 76% COMPARISON:  None. FINDINGS: CT CHEST FINDINGS Cardiovascular: Normal heart size. No pericardial effusions. Coronary artery calcifications. Normal caliber thoracic aorta. Scattered aortic calcifications. Aberrant right subclavian vein. Cardiac pacemaker. Mediastinum/Nodes: No significant lymphadenopathy. Esophagus is decompressed. Lungs/Pleura: Emphysematous changes with mild peripheral fibrosis in the lungs. No airspace disease or consolidation. No pleural effusions. No  pneumothorax. Musculoskeletal: Degenerative changes in the thoracic spine. No vertebral compression deformities. Normal alignment. Sternum appears intact. No acute depressed rib fractures identified. CT ABDOMEN PELVIS FINDINGS Hepatobiliary: Cholelithiasis with small stone in the gallbladder. No other inflammatory changes. No bile duct dilatation. No focal liver lesions. No liver laceration. Pancreas: Unremarkable. No pancreatic ductal dilatation or surrounding inflammatory changes. Spleen: Normal in size without focal abnormality. Adrenals/Urinary Tract: No adrenal hemorrhage or renal injury identified. Bladder is unremarkable. Stomach/Bowel: Stomach is within normal limits. Appendix appears normal. No evidence of bowel wall thickening, distention, or inflammatory changes. Diverticulosis of the colon without evidence of diverticulitis. Vascular/Lymphatic: Calcification of the aorta. No aneurysm. No significant lymphadenopathy. Reproductive: Prostate is unremarkable. Other: No free air or free fluid in the abdomen. Musculoskeletal: Degenerative changes in the lumbar spine. No vertebral compression deformities. There are mildly displaced fractures of the right transverse processes at L1 and L2. There is a hematoma measuring 4.5 x 12.4 cm in the posterior paraspinal musculature at the level of the right eleventh and twelfth ribs. No active contrast extravasation. Edema or bruising in the adjacent subcutaneous fat. Pelvis, sacrum, and hips appear intact. IMPRESSION: 1. Mildly displaced fractures of the right transverse processes at L1 and L2. 2. Hematoma in the posterior paraspinal musculature at the level of the right eleventh and twelfth ribs. No active contrast extravasation. 3. No evidence of mediastinal or pulmonary parenchymal injury. 4. No evidence of solid organ injury or bowel perforation. 5. Cholelithiasis. Electronically Signed   By: Burman NievesWilliam  Stevens M.D.   On: 09/08/2018 03:54    Pending Labs Unresulted  Labs (From admission, onward)    Start     Ordered   09/15/18 0500  Creatinine, serum  (enoxaparin (LOVENOX)    CrCl >/= 30 ml/min)  Weekly,   STAT    Comments:  while on enoxaparin therapy    09/08/18 1119          Vitals/Pain Today's Vitals   09/08/18 1430 09/08/18 1500 09/08/18 1530 09/08/18 1600  BP: (!) 101/53 103/67 (!) 98/57 (!) 95/53  Pulse: 62 84 61 89  Resp: 16   16  Temp:      TempSrc:      SpO2: (!) 88% 95% 97% 96%  Weight:      Height:      PainSc:        Isolation Precautions No active isolations  Medications Medications  enoxaparin (LOVENOX) injection 40 mg (has no administration in time range)  acetaminophen (TYLENOL) tablet 650 mg (has no administration in time range)    Or  acetaminophen (TYLENOL) suppository 650 mg (has no administration in time range)  ondansetron (ZOFRAN) tablet 4 mg (has no administration in time range)    Or  ondansetron (ZOFRAN) injection 4 mg (has no administration in time range)  docusate sodium (COLACE) capsule 100 mg (100 mg Oral Given 09/08/18 1212)  oxyCODONE-acetaminophen (PERCOCET/ROXICET) 5-325 MG per tablet 1 tablet (has no administration in time range)  oxyCODONE (Oxy IR/ROXICODONE) immediate release tablet 5 mg (5 mg Oral Given 09/08/18 1212)  cyclobenzaprine (FLEXERIL) tablet 10 mg (10 mg Oral Given 09/08/18 1332)  ketorolac (TORADOL) 30 MG/ML injection 30 mg (30 mg Intravenous Given 09/08/18 1332)  loratadine (CLARITIN) tablet 10 mg (has no administration in time range)  colchicine tablet 0.6 mg (has no administration in time range)  gabapentin (NEURONTIN) capsule 300 mg (has no administration in time range)  furosemide (LASIX) tablet 40 mg (0 mg Oral Hold 09/08/18 1212)  fish oil-omega-3 fatty acids capsule 2 g (0 g Oral Hold 09/08/18 1212)  Tiotropium Bromide Monohydrate AERS 2.5 mcg (has no administration in time range)  rosuvastatin (CRESTOR) tablet 5 mg (has no administration in time range)  METAMUCIL PLUS CALCIUM  CAPS 2 capsule (has no administration in time range)  Lutein CAPS 6 mg (6 mg Oral Not Given 09/08/18 1212)  diclofenac sodium (VOLTAREN) 1 % transdermal gel 1 application (has no administration in time range)  fentaNYL (SUBLIMAZE) injection 100 mcg (100 mcg Intravenous Given 09/08/18 0317)  ondansetron (ZOFRAN) injection 4 mg (4 mg Intravenous Given 09/08/18 0317)  iopamidol (ISOVUE-370) 76 % injection 125 mL (125 mLs Intravenous Contrast Given 09/08/18 0334)  ketorolac (TORADOL) 30 MG/ML injection 15 mg (15 mg Intravenous Given 09/08/18 0412)    Mobility Waiting on PT consult

## 2018-09-08 NOTE — H&P (Signed)
[name] [name] is an 85 y.o. male.   Chief Complaint: Fall HPI: The patient with past medical history of atrial fibrillation, hypertension and borderline diabetes presents to the emergency department after a fall.  He had been walking his dog when he tripped over a crack in the pavement and hit his lower back against a large potted plant.  The patient remembers feeling severe pain at the time but was able to gather himself and complete his walk before going home and resting.  However, he soon was in so much pain and so stiff that he felt unable to stand which prompted him to seek evaluation in the emergency department.  CT of his back showed mildly displaced fractures of the right transverse processes at the L1 and L2 as well is a significant hematoma at the level of the 11th and 12th ribs.  Due to significant pain and the need for ongoing analgesia the emergency department staff called the hospitalist service for admission.  Past Medical History:  Diagnosis Date  . Atrial fibrillation (HCC)   . Atrial fibrillation (HCC)   . GIB (gastrointestinal bleeding)    With colonic AVMs S/P coiling  . Hyperlipidemia   . Hypertension   . Metabolic syndrome   . Paroxysmal supraventricular tachycardia (HCC)   . PMR (polymyalgia rheumatica) (HCC)   . Sick sinus syndrome with tachycardia (HCC)   . Tachycardia     Past Surgical History:  Procedure Laterality Date  . CARPAL TUNNEL RELEASE  1982   Right  . CARPAL TUNNEL RELEASE  2000   Left  . COLONOSCOPY WITH PROPOFOL N/A 06/18/2016   Procedure: COLONOSCOPY WITH PROPOFOL;  Surgeon: Scot Junobert T Elliott, MD;  [facility]COPY;  Service: Endoscopy;  Laterality: N/A;  . FECAL TRANSPLANT N/A 06/18/2016   Procedure: FECAL TRANSPLANT;  Surgeon: Scot Junobert T Elliott, MD;  [facility];  Service: Endoscopy;  Laterality: N/A;  . KNEE SURGERY  1997   Left    Family History  Problem Relation Age of Onset  . Cancer Other   . Coronary  artery disease Other   . Other Mother   . Other Father    Social History:  reports that he quit smoking about 44 years ago. He has never used smokeless tobacco. He reports that he does not drink alcohol or use drugs.  Allergies: No Known Allergies  Prior to Admission medications   Medication Sig Start Date End Date Taking? Authorizing Provider  aspirin 325 MG tablet Take 325 mg by mouth daily.      [provider]  fish oil-omega-3 fatty acids 1000 MG capsule Take 2 g by mouth daily.      [provider]  furosemide (LASIX) 40 MG tablet Take one tablet on Monday, Wednesday and Friday. 01/15/11   Bensimhon, Bevelyn Bucklesaniel R, MD  Lutein 6 MG CAPS Take 1 capsule by mouth daily.      [provider]  metoprolol (TOPROL-XL) 100 MG 24 hr tablet Take 1.5 tablets (150 mg total) by mouth daily. 05/01/11   Antonieta IbaGollan, Timothy J, MD  Multiple Vitamins-Minerals (CENTRUM SILVER PO) Take by mouth daily.      [provider]  omeprazole (PRILOSEC) 20 MG capsule Take 1 capsule (20 mg total) by mouth daily. 01/15/11   Bensimhon, Bevelyn Bucklesaniel R, MD  potassium chloride SA (K-DUR,KLOR-CON) 20 MEQ tablet Take one tablet on Mondays, Wednesday and Fridays. 01/15/11   Bensimhon, Bevelyn Bucklesaniel R, MD  predniSONE (DELTASONE) 20 MG tablet Take 1 tablet (20  mg total) by mouth daily with breakfast. Take 3 tablets on day 1-2, take 2 tablets on day 3-4, take 1 tablet on day 5-6. 07/14/16   Lucia EstelleZheng, Feng, NP  psyllium (METAMUCIL) 58.6 % powder Take 1 packet by mouth 3 (three) times daily.    [provider]  Psyllium-Calcium (METAMUCIL PLUS CALCIUM) CAPS Take 2 capsules by mouth daily.      [provider]  rosuvastatin (CRESTOR) 5 MG tablet Take 5 mg by mouth daily.    [provider]  simvastatin (ZOCOR) 20 MG tablet TAKE 1 TABLET BY MOUTH AT BEDTIME 06/09/12   Antonieta IbaGollan, Timothy J, MD     Results for orders placed or performed during the hospital encounter of 09/08/18 (from the past 48  hour(s))  Comprehensive metabolic panel     Status: Abnormal   Collection Time: 09/08/18  3:16 AM  Result Value Ref Range   Sodium 139 135 - 145 mmol/L   Potassium 4.4 3.5 - 5.1 mmol/L   Chloride 103 98 - 111 mmol/L   CO2 27 22 - 32 mmol/L   Glucose, Bld 170 (H) 70 - 99 mg/dL   BUN 30 (H) 8 - 23 mg/dL   Creatinine, Ser 4.090.95 0.61 - 1.24 mg/dL   Calcium 8.8 (L) 8.9 - 10.3 mg/dL   Total Protein 7.0 6.5 - 8.1 g/dL   Albumin 3.9 3.5 - 5.0 g/dL   AST 26 15 - 41 U/L   ALT 15 0 - 44 U/L   Alkaline Phosphatase 118 38 - 126 U/L   Total Bilirubin 0.7 0.3 - 1.2 mg/dL   GFR calc non Af Amer >60 >60 mL/min   GFR calc Af Amer >60 >60 mL/min   Anion gap 9 5 - 15    Comment: Performed at [facility], [address] [place], [place] [address]  CBC with Differential     Status: Abnormal   Collection Time: 09/08/18  3:16 AM  Result Value Ref Range   WBC 10.3 4.0 - 10.5 K/uL   RBC 3.68 (L) 4.22 - 5.81 MIL/uL   Hemoglobin 11.7 (L) 13.0 - 17.0 g/dL   HCT 47.834.8 (L) 29.539.0 - 62.152.0 %   MCV 94.6 80.0 - 100.0 fL   MCH 31.8 26.0 - 34.0 pg   MCHC 33.6 30.0 - 36.0 g/dL   RDW 30.812.9 65.711.5 - 84.615.5 %   Platelets 243 150 - 400 K/uL   nRBC 0.0 0.0 - 0.2 %   Neutrophils Relative % 80 %   Neutro Abs 8.2 (H) 1.7 - 7.7 K/uL   Lymphocytes Relative 14 %   Lymphs Abs 1.4 0.7 - 4.0 K/uL   Monocytes Relative 6 %   Monocytes Absolute 0.6 0.1 - 1.0 K/uL   Eosinophils Relative 0 %   Eosinophils Absolute 0.0 0.0 - 0.5 K/uL   Basophils Relative 0 %   Basophils Absolute 0.0 0.0 - 0.1 K/uL   Immature Granulocytes 0 %   Abs Immature Granulocytes 0.04 0.00 - 0.07 K/uL    Comment: Performed at [facility], [address] [place], [place] [address]   Dg Ribs Unilateral W/chest Right  Result Date: 09/07/2018 CLINICAL DATA:  Fall, right posterior rib pain EXAM: RIGHT RIBS AND CHEST - 3+ VIEW COMPARISON:  None. FINDINGS: Lungs are clear.  No pleural effusion or pneumothorax. The heart is normal in size.  Right chest pacemaker. Thoracic aortic atherosclerosis. No displaced right rib fracture is seen. IMPRESSION: No evidence of acute cardiopulmonary disease.  No displaced right rib fracture is seen. Electronically Signed   By: Charline Bills M.D.   On: 09/07/2018 23:15   Ct Chest W Contrast  Result Date: 09/08/2018 CLINICAL DATA:  Trip and fall injury. Left-sided rib pain. EXAM: CT CHEST, ABDOMEN, AND PELVIS WITH CONTRAST TECHNIQUE: Multidetector CT imaging of the chest, abdomen and pelvis was performed following the standard protocol during bolus administration of intravenous contrast. CONTRAST:  ISOVUE-370 IOPAMIDOL (ISOVUE-370) INJECTION 76% COMPARISON:  None. FINDINGS: CT CHEST FINDINGS Cardiovascular: Normal heart size. No pericardial effusions. Coronary artery calcifications. Normal caliber thoracic aorta. Scattered aortic calcifications. Aberrant right subclavian vein. Cardiac pacemaker. Mediastinum/Nodes: No significant lymphadenopathy. Esophagus is decompressed. Lungs/Pleura: Emphysematous changes with mild peripheral fibrosis in the lungs. No airspace disease or consolidation. No pleural effusions. No pneumothorax. Musculoskeletal: Degenerative changes in the thoracic spine. No vertebral compression deformities. Normal alignment. Sternum appears intact. No acute depressed rib fractures identified. CT ABDOMEN PELVIS FINDINGS Hepatobiliary: Cholelithiasis with small stone in the gallbladder. No other inflammatory changes. No bile duct dilatation. No focal liver lesions. No liver laceration. Pancreas: Unremarkable. No pancreatic ductal dilatation or surrounding inflammatory changes. Spleen: Normal in size without focal abnormality. Adrenals/Urinary Tract: No adrenal hemorrhage or renal injury identified. Bladder is unremarkable. Stomach/Bowel: Stomach is within normal limits. Appendix appears normal. No evidence of bowel wall thickening, distention, or inflammatory changes. Diverticulosis of the  colon without evidence of diverticulitis. Vascular/Lymphatic: Calcification of the aorta. No aneurysm. No significant lymphadenopathy. Reproductive: Prostate is unremarkable. Other: No free air or free fluid in the abdomen. Musculoskeletal: Degenerative changes in the lumbar spine. No vertebral compression deformities. There are mildly displaced fractures of the right transverse processes at L1 and L2. There is a hematoma measuring 4.5 x 12.4 cm in the posterior paraspinal musculature at the level of the right eleventh and twelfth ribs. No active contrast extravasation. Edema or bruising in the adjacent subcutaneous fat. Pelvis, sacrum, and hips appear intact. IMPRESSION: 1. Mildly displaced fractures of the right transverse processes at L1 and L2. 2. Hematoma in the posterior paraspinal musculature at the level of the right eleventh and twelfth ribs. No active contrast extravasation. 3. No evidence of mediastinal or pulmonary parenchymal injury. 4. No evidence of solid organ injury or bowel perforation. 5. Cholelithiasis. Electronically Signed   By: Burman Nieves M.D.   On: 09/08/2018 03:54   Ct Abdomen Pelvis W Contrast  Result Date: 09/08/2018 CLINICAL DATA:  Trip and fall injury. Left-sided rib pain. EXAM: CT CHEST, ABDOMEN, AND PELVIS WITH CONTRAST TECHNIQUE: Multidetector CT imaging of the chest, abdomen and pelvis was performed following the standard protocol during bolus administration of intravenous contrast. CONTRAST:  ISOVUE-370 IOPAMIDOL (ISOVUE-370) INJECTION 76% COMPARISON:  None. FINDINGS: CT CHEST FINDINGS Cardiovascular: Normal heart size. No pericardial effusions. Coronary artery calcifications. Normal caliber thoracic aorta. Scattered aortic calcifications. Aberrant right subclavian vein. Cardiac pacemaker. Mediastinum/Nodes: No significant lymphadenopathy. Esophagus is decompressed. Lungs/Pleura: Emphysematous changes with mild peripheral fibrosis in the lungs. No airspace disease or  consolidation. No pleural effusions. No pneumothorax. Musculoskeletal: Degenerative changes in the thoracic spine. No vertebral compression deformities. Normal alignment. Sternum appears intact. No acute depressed rib fractures identified. CT ABDOMEN PELVIS FINDINGS Hepatobiliary: Cholelithiasis with small stone in the gallbladder. No other inflammatory changes. No bile duct dilatation. No focal liver lesions. No liver laceration. Pancreas: Unremarkable. No pancreatic ductal dilatation or surrounding inflammatory changes. Spleen: Normal in size without focal abnormality. Adrenals/Urinary Tract: No adrenal hemorrhage or renal injury identified. Bladder is unremarkable. Stomach/Bowel: Stomach  is within normal limits. Appendix appears normal. No evidence of bowel wall thickening, distention, or inflammatory changes. Diverticulosis of the colon without evidence of diverticulitis. Vascular/Lymphatic: Calcification of the aorta. No aneurysm. No significant lymphadenopathy. Reproductive: Prostate is unremarkable. Other: No free air or free fluid in the abdomen. Musculoskeletal: Degenerative changes in the lumbar spine. No vertebral compression deformities. There are mildly displaced fractures of the right transverse processes at L1 and L2. There is a hematoma measuring 4.5 x 12.4 cm in the posterior paraspinal musculature at the level of the right eleventh and twelfth ribs. No active contrast extravasation. Edema or bruising in the adjacent subcutaneous fat. Pelvis, sacrum, and hips appear intact. IMPRESSION: 1. Mildly displaced fractures of the right transverse processes at L1 and L2. 2. Hematoma in the posterior paraspinal musculature at the level of the right eleventh and twelfth ribs. No active contrast extravasation. 3. No evidence of mediastinal or pulmonary parenchymal injury. 4. No evidence of solid organ injury or bowel perforation. 5. Cholelithiasis. Electronically Signed   By: Burman Nieves M.D.   On:  09/08/2018 03:54    Review of Systems  Constitutional: Negative for chills and fever.  HENT: Negative for sore throat and tinnitus.   Eyes: Negative for blurred vision and redness.  Respiratory: Negative for cough and shortness of breath.   Cardiovascular: Negative for chest pain, palpitations, orthopnea and PND.  Gastrointestinal: Negative for abdominal pain, diarrhea, nausea and vomiting.  Genitourinary: Negative for dysuria, frequency and urgency.  Musculoskeletal: Positive for back pain. Negative for joint pain and myalgias.  Skin: Negative for rash.       No lesions  Neurological: Negative for speech change, focal weakness and weakness.  Endo/Heme/Allergies: Does not bruise/bleed easily.       No temperature intolerance  Psychiatric/Behavioral: Negative for depression and suicidal ideas.    Blood pressure 128/88, pulse 76, temperature 97.9 F (36.6 C), temperature source Oral, resp. rate 18, height 6\' 2"  (1.88 m), weight 107.5 kg, SpO2 98 %. Physical Exam  Vitals reviewed. Constitutional: He is oriented to person, place, and time. He appears well-developed and well-nourished. No distress.  HENT:  Head: Normocephalic and atraumatic.  Mouth/Throat: Oropharynx is clear and moist.  Eyes: Pupils are equal, round, and reactive to light. Conjunctivae and EOM are normal. No scleral icterus.  Neck: Normal range of motion. Neck supple. No JVD present. No tracheal deviation present. No thyromegaly present.  Cardiovascular: Normal rate, regular rhythm and normal heart sounds. Exam reveals no gallop and no friction rub.  No murmur heard. Respiratory: Effort normal and breath sounds normal. No respiratory distress.  GI: Soft. Bowel sounds are normal. He exhibits no distension. There is no abdominal tenderness.  Genitourinary:    Genitourinary Comments: Deferred   Musculoskeletal: Normal range of motion.        General: No edema.  Lymphadenopathy:    He has no cervical adenopathy.   Neurological: He is alert and oriented to person, place, and time. No cranial nerve deficit.  Skin: Skin is warm and dry. No rash noted. No erythema.  Psychiatric: He has a normal mood and affect. His behavior is normal. Judgment and thought content normal.     Assessment/Plan This is an 85 year old male admitted for back pain. 1.  Back pain: Secondary to transverse process fracture of L1-2.  Manage pain.  Physical therapy and occupational therapy eval prior to discharge. 2.  Atrial fibrillation: Rate controlled; continue metoprolol.  Continue aspirin. 3.  Hypertension: Controlled; continue to monitor.  4.  Carpal tunnel syndrome: Stable. 5.  Hyperlipidemia: Continue statin therapy 6.  DVT prophylaxis: Lovenox 7.  GI prophylaxis: None The patient is a full code.  Time spent on admission orders and patient care approximately 45 minutes  Arnaldo Natal, MD 09/08/2018, 6:59 AM

## 2018-09-09 ENCOUNTER — Encounter: Payer: Self-pay | Admitting: *Deleted

## 2018-09-09 DIAGNOSIS — S32019A Unspecified fracture of first lumbar vertebra, initial encounter for closed fracture: Secondary | ICD-10-CM | POA: Diagnosis not present

## 2018-09-09 MED ORDER — POLYETHYLENE GLYCOL 3350 17 G PO PACK: 17.0000 g | PACK | Freq: Once | ORAL | Status: DC

## 2018-09-09 MED ORDER — ACETAMINOPHEN 325 MG PO TABS: 650.0000 mg | ORAL_TABLET | Freq: Four times a day (QID) | ORAL | Status: AC | PRN

## 2018-09-09 MED ORDER — CYCLOBENZAPRINE HCL 10 MG PO TABS: 10.0000 mg | ORAL_TABLET | Freq: Three times a day (TID) | ORAL | 0 refills | Status: DC | PRN

## 2018-09-09 MED ORDER — POLYETHYLENE GLYCOL 3350 17 G PO PACK: 17.0000 g | PACK | Freq: Every day | ORAL | Status: DC | PRN

## 2018-09-09 MED ORDER — OXYCODONE HCL 5 MG PO TABS: 5.0000 mg | ORAL_TABLET | Freq: Four times a day (QID) | ORAL | 0 refills | Status: DC | PRN

## 2018-09-09 MED ORDER — IBUPROFEN 400 MG PO TABS: 400.0000 mg | ORAL_TABLET | Freq: Four times a day (QID) | ORAL | 0 refills | Status: AC | PRN

## 2018-09-09 MED ORDER — POLYETHYLENE GLYCOL 3350 17 G PO PACK: 17.0000 g | PACK | Freq: Every day | ORAL | 0 refills | Status: AC | PRN

## 2018-09-09 MED ORDER — IBUPROFEN 400 MG PO TABS: 400.0000 mg | ORAL_TABLET | Freq: Four times a day (QID) | ORAL | Status: DC | PRN

## 2018-09-09 NOTE — Plan of Care (Signed)
Patient daughter frustrated with ED and admission chain of events.  Neuro Surgery rounded this AM and gave verbal OK for PT to assess/treat since L1-L2 fracture requires no surgery at this time. PT notified to round on patient when needed, since possible pending discharge.   Pain meds given to assist with PT assessment.    Leadership informed to please round and discuss daughter concerns, per daughters request.  Hospitalist informed of daughters request to round and discuss POC when able.

## 2018-09-09 NOTE — Progress Notes (Signed)
OT Cancellation Note  Patient Details Name: [name] [name] MRN: [id number] DOB: [birth year]   Cancelled Treatment:    Reason Eval/Treat Not Completed: Other (comment). Pt working with PT upon attempt to perform OT evaluation. Will re-attempt at later date/time as pt is available and medically appropriate.   Richrd Prime, MPH, MS, OTR/L ascom [phone] 09/09/18, 9:49 AM

## 2018-09-09 NOTE — Plan of Care (Signed)
Discussed with patient and daughter plan of care for the evening, pain management and taking his muscle relaxant at bedtime with some teach back displayed.  Patient's daughter is concerned and wants him transferred to another hospital.  Per 1st shift charge RN and AD the morning primary MD was handling and explained to daughter that it has to be a physician to physician initiated procedure with no teach back displayed at this time.  Charge RN aware and will be addressed in the AM.

## 2018-09-09 NOTE — Care Management (Signed)
Jason with [facility] notified of the RW and BSC need, will bring to the room

## 2018-09-09 NOTE — Plan of Care (Signed)
Pharm consult placed to re-reconcile meds, per family request.

## 2018-09-09 NOTE — Discharge Planning (Addendum)
Patient IV removed. RN assessment and VS revealed stability for DC to home with [facility].  Discharge paperas given, explained and educated.  Informed os suggested FU appt and appt made (1/16 @ 1240)  Scripts printed and pain script signed. Pain under control prior to DC. Once ready, will be wheeled to front and family to transport home via car.

## 2018-09-09 NOTE — Evaluation (Signed)
Occupational Therapy Evaluation Patient Details Name: [name] [name] MRN: [id number] DOB: 03-22-1932 Today's Date: 09/09/2018    History of Present Illness Pt is a 85 y/o M s/p fall with resultant LBP.  CT showed mildly displaced fxs of R transverse processes at L1-2 as well as significant hematoma 11-12th ribs.  Neurosurgery consulted who suggested pain management and no surgical intervention, LSO brace as needed.  Pt's PMH includes a-fib.   Clinical Impression   Pt seen for OT evaluation this date. Prior to hospital admission, pt was independent with mobility, ADL, and IADL but with multiple falls within the past year, at least 4 since 07/2018. Pt lives with his spouse and small dog, Toney Reil, in a single family home with a w/c lift to get up into the house. Spouse able to provide 24/7 assist/support as needed but is limited in ability to provide significant physical assist. Pt reports he enjoys being outdoors and will walk his dog around the neighborhood frequently, and often stopping at neighbors' homes to offer dog treats to his neighbors' dogs. In doing so, pt endorses crossing yards and paying less attention to his mobility and picking his feet up, which has led to several of his falls. OT facilitated problem solving with pt and spouse to participate in walks while minimizing falls risk. Pt verbalized understanding.  Currently pt requires PRN Min A for LB ADL and cues to incorporate back precautions (not required, but as guideline to minimize back pain and support recovery). Pt/spouse instructed in back precautions handout which covers modified techniques for bed mobility and ADL, self care skills, AE/DME for bathing, dressing, and toileting needs, and home/routines modifications and falls prevention strategies to maximize safety and functional independence while minimizing falls risk. Pt/spouse verbalized understanding of all education/training provided. Handout provided to support recall  and carry over of learned precautions/techniques for bed mobility, functional transfers, and self care skills. Pt will benefit from additional skilled OT while in the hospital to support return to PLOF. Do not anticipate any skilled OT needs upon discharge. Pt/spouse agree with recommendations.    Follow Up Recommendations  No OT follow up    Equipment Recommendations  None recommended by OT    Recommendations for Other Services       Precautions / Restrictions Precautions Precautions: Fall;Other (comment) Precaution Comments: no back precautions specified by MD; however, educated pt in log roll technique and back precautions; handout provided by OT Required Braces or Orthoses: Other Brace Other Brace: TLSO mentioned for comfort/prn in Neuro's note; however, no brace in room and pt's pain 2-3/10 this session Restrictions Weight Bearing Restrictions: No      Mobility Bed Mobility       General bed mobility comments: deferred; pt up in recliner. Pt/spouse educated in log roll technique with handout provided with written and visual instructions to support and recall carryover   Transfers Overall transfer level: Needs assistance Equipment used: Rolling walker (2 wheeled) Transfers: Sit to/from Stand Sit to Stand: Supervision         General transfer comment: Cues for hand placement.  Pt slow to rise.      Balance Overall balance assessment: Needs assistance;History of Falls Sitting-balance support: No upper extremity supported;Feet supported Sitting balance-Leahy Scale: Good     Standing balance support: No upper extremity supported;During functional activity Standing balance-Leahy Scale: Fair Standing balance comment: Pt able to stand statically without UE support but would likely lose his balance with perturbation  ADL either performed or assessed with clinical judgement   ADL Overall ADL's : Needs assistance/impaired                                        General ADL Comments: CGA for toilet t/f and functional mobility with RW; PRN Min A for LB ADL     Vision Baseline Vision/History: Wears glasses Wears Glasses: At all times Patient Visual Report: No change from baseline       Perception     Praxis      Pertinent Vitals/Pain Pain Assessment: 0-10 Pain Score: 3  Pain Location: low back pain Pain Descriptors / Indicators: Guarding;Dull;Aching Pain Intervention(s): Limited activity within patient's tolerance;Monitored during session;Repositioned     Hand Dominance     Extremity/Trunk Assessment Upper Extremity Assessment Upper Extremity Assessment: Overall WFL for tasks assessed   Lower Extremity Assessment Lower Extremity Assessment: Overall WFL for tasks assessed   Cervical / Trunk Assessment Cervical / Trunk Assessment: Other exceptions Cervical / Trunk Exceptions: back pain (see HPI)   Communication Communication Communication: No difficulties   Cognition Arousal/Alertness: Awake/alert Behavior During Therapy: WFL for tasks assessed/performed Overall Cognitive Status: Within Functional Limits for tasks assessed                                     General Comments  Wife present during evaluation.     Exercises Other Exercises: pt/spouse instructed in AE/DME and home/routines modifications to minimize back pain/twisting/bending/arching during ADL and IADL Other Exercises: pt/spouse educated in falls prevention strategies and encouraged modifications to habits/routines, particularly when outside to minimize falls risk   Shoulder Instructions      Home Living Family/patient expects to be discharged to:: Private residence Living Arrangements: Spouse/significant other;Other (Comment)(small dog) Available Help at Discharge: Family;Available 24 hours/day(although wife able to provide limited physical assistalthough wife able to provide limited physical  assist) Type of Home: House Home Access: Elevator(w/c lift for steps)     Home Layout: One level     Bathroom Shower/Tub: Producer, television/film/video: Standard(has both, has toilet riser for both toilets) Bathroom Accessibility: Yes   Home Equipment: Walker - 4 wheels;Cane - single point;Shower seat;Toilet riser;Grab bars - tub/shower;Hand held shower head;Wheelchair - IT trainer;Adaptive equipment Adaptive Equipment: Reacher        Prior Functioning/Environment Level of Independence: Independent        Comments: Pt reports his family has encouraged him to use SPC but he does not use one.  Has had 3 falls in the past several weeks.  Ind with ADLs and still driving. Pt enjoys being outdoors and walks dog - all falls have occurred outside and pt endorses "not picking up my feet when I walk"        OT Problem List: Decreased knowledge of use of DME or AE;Pain;Impaired balance (sitting and/or standing)      OT Treatment/Interventions: Self-care/ADL training;Balance training;Therapeutic exercise;Therapeutic activities;DME and/or AE instruction;Patient/family education    OT Goals(Current goals can be found in the care plan section) Acute Rehab OT Goals Patient Stated Goal: to return to PLOF with decreased risk of falls OT Goal Formulation: With patient/family Time For Goal Achievement: 09/23/18 Potential to Achieve Goals: Good ADL Goals Pt Will Perform Lower Body Dressing: with modified independence;sit to/from stand(w/ AE as needed, using  modified techniques as needed to minimize back pain/bending) Additional ADL Goal #1: Pt will verbalize plan to implement at least 2 learned falls prevention strategies. Additional ADL Goal #2: Pt will demo modified independence with bed mobility using learned log roll technique to minimize twisting/bending of the low back. Additional ADL Goal #3: Pt will perform all aspects of toileting with modified independence using learned  techniques to minimize back bending/twisting/straining.  OT Frequency: Min 2X/week   Barriers to D/C:            Co-evaluation              AM-PAC OT "6 Clicks" Daily Activity     Outcome Measure Help from another person eating meals?: None Help from another person taking care of personal grooming?: None Help from another person toileting, which includes using toliet, bedpan, or urinal?: A Little Help from another person bathing (including washing, rinsing, drying)?: A Little Help from another person to put on and taking off regular upper body clothing?: None Help from another person to put on and taking off regular lower body clothing?: A Little 6 Click Score: 21   End of Session    Activity Tolerance: Patient tolerated treatment well Patient left: in chair;with call bell/phone within reach;with chair alarm set;with family/visitor present  OT Visit Diagnosis: Unsteadiness on feet (R26.81);Repeated falls (R29.6);Pain Pain - Right/Left: (back)                Time: [id number] OT Time Calculation (min): 24 min Charges:  OT General Charges $OT Visit: 1 Visit OT Evaluation $OT Eval Low Complexity: 1 Low OT Treatments $Self Care/Home Management : 8-22 mins  Richrd PrimeJamie Stiller, MPH, MS, OTR/L ascom [phone] 09/09/18, 11:33 AM

## 2018-09-09 NOTE — Evaluation (Signed)
Physical Therapy Evaluation Patient Details Name: [name] [name] MRN: [id number] DOB: [birth year] Today's Date: 09/09/2018   History of Present Illness  Pt is a 85 y/o M s/p fall with resultant LBP.  CT showed mildly displaced fxs of R transverse processes at L1-2 as well as significant hematoma 11-12th ribs.  Neurosurgery consulted who suggested pain management and no surgical intervention, LSO brace as needed.  Pt's PMH includes a-fib.    Clinical Impression  Pt admitted with above diagnosis. Pt currently with functional limitations due to the deficits listed below (see PT Problem List).  [name] [name] is independent at baseline but with frequent falls, reporting 3 in the past few weeks alone.  Pt instructed in back precautions and log roll to protect back and for dec pain with ADLs.  Pt ambulated with RW with supervision for safety with no signs of instability.  Recommending pt use rollator moving forward and work with HHPT at d/c for improved balance.  Pt will benefit from skilled PT to increase their independence and safety with mobility to allow discharge to the venue listed below.      Follow Up Recommendations Home health PT    Equipment Recommendations  None recommended by PT    Recommendations for Other Services OT consult     Precautions / Restrictions Precautions Precautions: Fall;Other (comment) Precaution Comments: no back precautions specified by MD; however, educated pt in log roll technique and back precautions  Required Braces or Orthoses: Other Brace Other Brace: TLSO mentioned for comfort/prn in Neuro's note; however, no brace in room and pt's pain 2-3/10 this session Restrictions Weight Bearing Restrictions: No      Mobility  Bed Mobility Overal bed mobility: Needs Assistance Bed Mobility: Rolling;Sidelying to Sit Rolling: Supervision Sidelying to sit: Supervision       General bed mobility comments: HOB flat and no railing to simulate home  environment.  Cues for log roll technique.   Transfers Overall transfer level: Needs assistance Equipment used: Rolling walker (2 wheeled) Transfers: Sit to/from Stand Sit to Stand: Supervision         General transfer comment: Cues for hand placement.  Pt slow to rise.    Ambulation/Gait Ambulation/Gait assistance: Supervision Gait Distance (Feet): 160 Feet Assistive device: Rolling walker (2 wheeled) Gait Pattern/deviations: Step-through pattern;Decreased step length - right;Decreased step length - left Gait velocity: decreased   General Gait Details: Pt with guarded posture due to pain.  Good upright posture.  Cues to relax UEs. Dec gait speed.   Stairs            Wheelchair Mobility    Modified Rankin (Stroke Patients Only)       Balance Overall balance assessment: Needs assistance;History of Falls Sitting-balance support: No upper extremity supported;Feet supported Sitting balance-Leahy Scale: Good     Standing balance support: No upper extremity supported;During functional activity Standing balance-Leahy Scale: Fair Standing balance comment: Pt able to stand statically without UE support but would likely lose his balance with perturbation                             Pertinent Vitals/Pain Pain Assessment: 0-10 Pain Score: 3  Pain Location: low back pain Pain Descriptors / Indicators: Guarding;Dull;Aching Pain Intervention(s): Limited activity within patient's tolerance;Monitored during session;Repositioned;Utilized relaxation techniques;Premedicated before session    Home Living Family/patient expects to be discharged to:: Private residence Living Arrangements: Spouse/significant other Available Help at Discharge: Family;Available 24 hours/day(although wife  able to provide limited physical assist) Type of Home: House Home Access: Elevator     Home Layout: One level Home Equipment: Walker - 4 wheels;Cane - single point;Shower seat;Toilet  riser;Grab bars - tub/shower;Hand held shower head;Wheelchair - IT trainermanual;Electric scooter      Prior Function Level of Independence: Independent         Comments: Pt reports his family has encouraged him to use SPC but he does not use one.  Has had 3 falls in the past several weeks.  Ind with ADLs and still driving.      Hand Dominance        Extremity/Trunk Assessment   Upper Extremity Assessment Upper Extremity Assessment: Overall WFL for tasks assessed    Lower Extremity Assessment Lower Extremity Assessment: Overall WFL for tasks assessed    Cervical / Trunk Assessment Cervical / Trunk Assessment: Other exceptions Cervical / Trunk Exceptions: back pain (see HPI)  Communication   Communication: No difficulties  Cognition Arousal/Alertness: Awake/alert Behavior During Therapy: WFL for tasks assessed/performed Overall Cognitive Status: Within Functional Limits for tasks assessed                                        General Comments General comments (skin integrity, edema, etc.): Wife present during evaluation.     Exercises Other Exercises Other Exercises: Educated pt in back precautions (no bending, lifting, twisting) Other Exercises: Pt has soft back brace at home that he has used in the past.  Instructed pt to not sleep in brace due to increased risk of skin breakdown Other Exercises: Instructed pt to have feet supported when sitting in chair   Assessment/Plan    PT Assessment Patient needs continued PT services  PT Problem List Decreased balance;Decreased mobility;Decreased knowledge of use of DME;Decreased safety awareness;Decreased knowledge of precautions;Pain       PT Treatment Interventions DME instruction;Gait training;Functional mobility training;Therapeutic activities;Therapeutic exercise;Balance training;Neuromuscular re-education;Patient/family education;Modalities    PT Goals (Current goals can be found in the Care Plan section)   Acute Rehab PT Goals Patient Stated Goal: to return to PLOF PT Goal Formulation: With patient Time For Goal Achievement: 09/23/18 Potential to Achieve Goals: Good    Frequency 7X/week   Barriers to discharge        Co-evaluation               AM-PAC PT "6 Clicks" Mobility  Outcome Measure Help needed turning from your back to your side while in a flat bed without using bedrails?: A Little Help needed moving from lying on your back to sitting on the side of a flat bed without using bedrails?: A Little Help needed moving to and from a bed to a chair (including a wheelchair)?: A Little Help needed standing up from a chair using your arms (e.g., wheelchair or bedside chair)?: A Little Help needed to walk in hospital room?: A Little Help needed climbing 3-5 steps with a railing? : A Little 6 Click Score: 18    End of Session Equipment Utilized During Treatment: Gait belt Activity Tolerance: Patient tolerated treatment well Patient left: in chair;with call bell/phone within reach;with chair alarm set;with family/visitor present;with nursing/sitter in room Nurse Communication: Mobility status PT Visit Diagnosis: Pain;Unsteadiness on feet (R26.81);Other abnormalities of gait and mobility (R26.89);Repeated falls (R29.6) Pain - Right/Left: (lower) Pain - part of body: (back)    Time: [id number] PT Time Calculation (min) (  ACUTE ONLY): 45 min   Charges:   PT Evaluation $PT Eval Low Complexity: 1 Low PT Treatments $Gait Training: 8-22 mins $Therapeutic Activity: 8-22 mins       Encarnacion Chu PT, DPT 09/09/2018, 10:47 AM

## 2018-09-09 NOTE — Discharge Instructions (Signed)
Follow-up with primary care physician in 3-4 days HOME HEALTH

## 2018-09-09 NOTE — Care Management Note (Signed)
Case Management Note  Patient Details  Name: [name] [name] MRN: [id number] Date of Birth: 21-Dec-1931  Subjective/Objective:                  Met with patient and family to discuss DC needs Patient lives at home with wife has a daughter for needs as well, daughter is a Marine scientist They have been given the [facility] list oer CMS.gov DME needs are rolling walker Bedside commode and a grabber,I explained that we do not have the grabbers here but they can get at any pharmacy or [facility] and [employer], they stated that they would not have any problems getting They chose [facility], [facility] Kelp with [facility] has accepted They were provided with a list of Private duty private pay Caregivers and nurses They are able to afford their medications They do not have a  Need for  transportation   Action/Plan: [facility] list provided per CMS.gov Expected Discharge Date:  09/09/18               Expected Discharge Plan:  Home/Self Care  In-House Referral:     Discharge planning Services  CM Consult  Post Acute Care Choice:    Choice offered to:     DME Arranged:    DME Agency:     HH Arranged:    HH Agency:     Status of Service:  In process, will continue to follow  If discussed at Long Length of Stay Meetings, dates discussed:    Additional Comments:  Su Hilt, RN 09/09/2018, 2:49 PM

## 2018-09-09 NOTE — Consult Note (Addendum)
Neurosurgery-New Consultation Evaluation 09/09/2018 Artice Minsky [id number]  Identifying Statement: Lennard Tuell is a 85 y.o. male from [facility] [place] [address] with fall and transverse process fracture  Physician Requesting Consultation: Ramonita Lab  History of Present Illness: Mr. Allender is admitted after a mechanical fall resulting in back pain. He states he has no new leg symptoms but does have some chronic foot numbness due to neuropathy. He was admitted to the hospital for pain control and he states that he continues to have back pain without leg pain. CT revealed transverse process fractures.   Past Medical History:  Past Medical History:  Diagnosis Date  . Atrial fibrillation (HCC)   . Atrial fibrillation (HCC)   . GIB (gastrointestinal bleeding)    With colonic AVMs S/P coiling  . Hyperlipidemia   . Hypertension   . Metabolic syndrome   . Paroxysmal supraventricular tachycardia (HCC)   . PMR (polymyalgia rheumatica) (HCC)   . Presence of permanent cardiac pacemaker   . Sick sinus syndrome with tachycardia (HCC)   . Sleep apnea   . Tachycardia     Social History: Social History   Socioeconomic History  . Marital status: Married    Spouse name: Not on file  . Number of children: Not on file  . Years of education: Not on file  . Highest education level: Not on file  Occupational History  . Occupation: Retired    Associate Professor: RETIRED  Social Needs  . Financial resource strain: Not on file  . Food insecurity:    Worry: Not on file    Inability: Not on file  . Transportation needs:    Medical: Not on file    Non-medical: Not on file  Tobacco Use  . Smoking status: Former Smoker    Last attempt to quit: 08/27/1974    Years since quitting: 44.0  . Smokeless tobacco: Never Used  Substance and Sexual Activity  . Alcohol use: No  . Drug use: No  . Sexual activity: Not Currently    Birth control/protection: None  Lifestyle  . Physical activity:   Days per week: Not on file    Minutes per session: Not on file  . Stress: Not on file  Relationships  . Social connections:    Talks on phone: Not on file    Gets together: Not on file    Attends religious service: Not on file    Active member of club or organization: Not on file    Attends meetings of clubs or organizations: Not on file    Relationship status: Not on file  . Intimate partner violence:    Fear of current or ex partner: Not on file    Emotionally abused: Not on file    Physically abused: Not on file    Forced sexual activity: Not on file  Other Topics Concern  . Not on file  Social History Narrative   Married   Gets regular exercise    Family History: Family History  Problem Relation Age of Onset  . Cancer Other   . Coronary artery disease Other   . Other Mother   . Other Father     Review of Systems:  Review of Systems - General ROS: Negative Psychological ROS: Negative Ophthalmic ROS: Negative ENT ROS: Negative Hematological and Lymphatic ROS: Negative  Endocrine ROS: Negative Respiratory ROS: Negative Cardiovascular ROS: Negative Gastrointestinal ROS: Negative Genito-Urinary ROS: Negative Musculoskeletal ROS: Positive for back pain Neurological ROS: Negative for leg pain or weakness  Dermatological ROS: Negative  Physical Exam: BP 110/69 (BP Location: Right Arm)   Pulse 96   Temp 98 F (36.7 C) (Oral)   Resp 18   Ht 6\' 2"  (1.88 m)   Wt 111.4 kg   SpO2 94%   BMI 31.55 kg/m  Body mass index is 31.55 kg/m. Body surface area is 2.41 meters squared. General appearance: Alert, cooperative, in discomfort lying supine in bed Head: Normocephalic, atraumatic Eyes: Normal, EOM intact Ext: No edema in LE bilaterally  Neurologic exam:  Mental status: alertness: alert,  affect: normal Speech: fluent and clear Motor:strength symmetric 5/5 in bilateral lower extremities in all motor groups Sensory: decreased to light touch in distal lower  extremities Gait: not tested given pain  Laboratory: Results for orders placed or performed during the hospital encounter of 09/08/18  Comprehensive metabolic panel  Result Value Ref Range   Sodium 139 135 - 145 mmol/L   Potassium 4.4 3.5 - 5.1 mmol/L   Chloride 103 98 - 111 mmol/L   CO2 27 22 - 32 mmol/L   Glucose, Bld 170 (H) 70 - 99 mg/dL   BUN 30 (H) 8 - 23 mg/dL   Creatinine, Ser 1.610.95 0.61 - 1.24 mg/dL   Calcium 8.8 (L) 8.9 - 10.3 mg/dL   Total Protein 7.0 6.5 - 8.1 g/dL   Albumin 3.9 3.5 - 5.0 g/dL   AST 26 15 - 41 U/L   ALT 15 0 - 44 U/L   Alkaline Phosphatase 118 38 - 126 U/L   Total Bilirubin 0.7 0.3 - 1.2 mg/dL   GFR calc non Af Amer >60 >60 mL/min   GFR calc Af Amer >60 >60 mL/min   Anion gap 9 5 - 15  CBC with Differential  Result Value Ref Range   WBC 10.3 4.0 - 10.5 K/uL   RBC 3.68 (L) 4.22 - 5.81 MIL/uL   Hemoglobin 11.7 (L) 13.0 - 17.0 g/dL   HCT 09.634.8 (L) 04.539.0 - 40.952.0 %   MCV 94.6 80.0 - 100.0 fL   MCH 31.8 26.0 - 34.0 pg   MCHC 33.6 30.0 - 36.0 g/dL   RDW 81.112.9 91.411.5 - 78.215.5 %   Platelets 243 150 - 400 K/uL   nRBC 0.0 0.0 - 0.2 %   Neutrophils Relative % 80 %   Neutro Abs 8.2 (H) 1.7 - 7.7 K/uL   Lymphocytes Relative 14 %   Lymphs Abs 1.4 0.7 - 4.0 K/uL   Monocytes Relative 6 %   Monocytes Absolute 0.6 0.1 - 1.0 K/uL   Eosinophils Relative 0 %   Eosinophils Absolute 0.0 0.0 - 0.5 K/uL   Basophils Relative 0 %   Basophils Absolute 0.0 0.0 - 0.1 K/uL   Immature Granulocytes 0 %   Abs Immature Granulocytes 0.04 0.00 - 0.07 K/uL   I personally reviewed labs  Imaging: CT Lumbar Spine: 1. Mildly displaced fractures of the right transverse processes at L1 and L2. 2. Hematoma in the posterior paraspinal musculature at the level of the right eleventh and twelfth ribs. No active contrast extravasation.   Impression/Plan:  Mr. Sallyanne KusterSellers is here with transverse process fractures at L1 and L2. I discussed with him that these are non-structural fractures but  painful. There is no surgery indicated but I do recommend pain control with LSO brace as needed, pain medications, and therapy. Given no neural symptoms, no further imaging is needed at this time. He is clear to ambulate and no follow up needed with neurosurgery.  1.  Diagnosis: Transverse process fractures of lumbar spine  2.  Plan - No surgical intervention - Pain management by primary team

## 2018-09-09 NOTE — Progress Notes (Signed)
Call placed to [facility] transfer center yesterday per family request awaiting callback from the hospitalist at [facility].  This is explained to the RN [place] yesterday evening ,she said she will convey this message to the family members.  I have also updated the same info to my colleague Dr. Auburn Bilberry yesterday evening Discussed with neurosurgery Dr. Adriana Simas, who is not recommending any surgical interventions and okay to get PT evaluation.  He is recommending pain management,  a brace for support and  possible discharge if patient is stable

## 2018-09-09 NOTE — Discharge Summary (Signed)
[facility] - [place] at [facility]   PATIENT NAME: [name] [name]    MR#:  [id number]  DATE OF BIRTH:  12/19/1931  DATE OF ADMISSION:  09/08/2018 ADMITTING PHYSICIAN: Arnaldo Natal, MD  DATE OF DISCHARGE: 09/09/2018  PRIMARY CARE PHYSICIAN: Dione Housekeeper, MD    ADMISSION DIAGNOSIS:  Pain [R52] Closed fracture dislocation of lumbar spine, initial encounter (HCC) [S32.009A]  DISCHARGE DIAGNOSIS:   Acute low back pain SECONDARY DIAGNOSIS:   Past Medical History:  Diagnosis Date  . Atrial fibrillation (HCC)   . Atrial fibrillation (HCC)   . GIB (gastrointestinal bleeding)    With colonic AVMs S/P coiling  . Hyperlipidemia   . Hypertension   . Metabolic syndrome   . Paroxysmal supraventricular tachycardia (HCC)   . PMR (polymyalgia rheumatica) (HCC)   . Presence of permanent cardiac pacemaker   . Sick sinus syndrome with tachycardia (HCC)   . Sleep apnea   . Tachycardia     HOSPITAL COURSE:  HPI: The patient with past medical history of atrial fibrillation, hypertension and borderline diabetes presents to the emergency department after a fall.  He had been walking his dog when he tripped over a crack in the pavement and hit his lower back against a large potted plant.  The patient remembers feeling severe pain at the time but was able to gather himself and complete his walk before going home and resting.  However, he soon was in so much pain and so stiff that he felt unable to stand which prompted him to seek evaluation in the emergency department.  CT of his back showed mildly displaced fractures of the right transverse processes at the L1 and L2 as well is a significant hematoma at the level of the 11th and 12th ribs.  Due to significant pain and the need for ongoing analgesia the emergency department staff called the hospitalist service for admission.   1.  Acute back pain secondary to displaced transverse process of L1 and L2 on the  right side Pain well controlled with current pain management Pain management as needed.  Patient is refusing scheduled pain meds Toradol 30 mg IV every 6 hours and oxycodone as needed given during the hospital course.  Patient is agreeable to go home with ibuprofen /oxycodone p.o. as needed.  MiraLAX is added to the regimen as needed for constipation.  Patient takes Metamucil twice a day recommended to continue the same Continue muscle relaxants as needed Neurosurgery consult placed to Dr. Adriana Simas by me and secured text message sent via haiku.  Dr. Adriana Simas has called and recommended no surgical interventions and okay to discharge patient if patient is stable and pain is okay after PT assessment Physical therapy  and occupational therapy is recommending home health PT and OT  2. Atrial fibrillation: Rate controlled; hold metoprolol in view of soft blood pressure Continue aspirin.  3. Hypertension: Low normal.  Hold metoprolol at this time  4. Carpal tunnel syndrome: Stable.  5. Hyperlipidemia: Continue statin therapy  6. DVT prophylaxis: Lovenox cutaneously 7. GI prophylaxis: None   Plan of care discussed with the patient and his wife at bedside.  They both verbalized understanding of the plan and agreeable.  All their questions are answered to their satisfaction DISCHARGE CONDITIONS:   fair  CONSULTS OBTAINED:  Treatment Team:  Lucy Chris, MD   PROCEDURES none  DRUG ALLERGIES:  No Known Allergies  DISCHARGE MEDICATIONS:   Allergies as of 09/09/2018   No Known  Allergies     Medication List    STOP taking these medications   metoprolol succinate 100 MG 24 hr tablet Commonly known as:  TOPROL-XL   predniSONE 20 MG tablet Commonly known as:  DELTASONE     TAKE these medications   acetaminophen 325 MG tablet Commonly known as:  TYLENOL Take 2 tablets (650 mg total) by mouth every 6 (six) hours as needed for mild pain or headache (or Fever >/= 101).   aspirin  EC 81 MG tablet Take 81 mg by mouth daily.   CENTRUM SILVER PO Take by mouth daily.   cetirizine 10 MG tablet Commonly known as:  ZYRTEC Take 10 mg by mouth daily.   colchicine 0.6 MG tablet Take 0.6 mg by mouth daily.   cyclobenzaprine 10 MG tablet Commonly known as:  FLEXERIL Take 1 tablet (10 mg total) by mouth 3 (three) times daily as needed for muscle spasms.   diclofenac sodium 1 % Gel Commonly known as:  VOLTAREN Apply 1 application topically as needed.   ferrous sulfate 325 (65 FE) MG tablet Take 325 mg by mouth daily.   fish oil-omega-3 fatty acids 1000 MG capsule Take 2 g by mouth daily.   Fish Oil 1000 MG Caps Take 1,000 mg by mouth daily.   furosemide 40 MG tablet Commonly known as:  LASIX Take one tablet on Monday, Wednesday and Friday. What changed:  how much to take   gabapentin 300 MG capsule Commonly known as:  NEURONTIN Take 300 mg by mouth 2 (two) times daily.   ibuprofen 400 MG tablet Commonly known as:  ADVIL,MOTRIN Take 1 tablet (400 mg total) by mouth every 6 (six) hours as needed for moderate pain.   Lutein 6 MG Caps Take 1 capsule by mouth daily.   METAMUCIL PLUS CALCIUM Caps Take 2 capsules by mouth daily.   metFORMIN 500 MG tablet Commonly known as:  GLUCOPHAGE Take 500 mg by mouth 2 (two) times daily with a meal.   omeprazole 20 MG capsule Commonly known as:  PRILOSEC Take 1 capsule (20 mg total) by mouth daily.   oxyCODONE 5 MG immediate release tablet Commonly known as:  Oxy IR/ROXICODONE Take 1 tablet (5 mg total) by mouth every 6 (six) hours as needed for severe pain or breakthrough pain.   polyethylene glycol packet Commonly known as:  MIRALAX / GLYCOLAX Take 17 g by mouth daily as needed for moderate constipation.   potassium chloride SA 20 MEQ tablet Commonly known as:  K-DUR,KLOR-CON Take one tablet on Mondays, Wednesday and Fridays.   psyllium 58.6 % powder Commonly known as:  METAMUCIL Take 1 packet by mouth  3 (three) times daily.   rosuvastatin 5 MG tablet Commonly known as:  CRESTOR Take 5 mg by mouth daily.   SPIRIVA RESPIMAT 2.5 MCG/ACT Aers Generic drug:  Tiotropium Bromide Monohydrate Inhale 2.5 mcg into the lungs daily.   triamcinolone cream 0.1 % Commonly known as:  KENALOG Apply 1 application topically 2 (two) times daily as needed.        DISCHARGE INSTRUCTIONS:   Follow-up with primary care physician in 3-4 days HOME HEALTH   DIET:  Cardiac diet  DISCHARGE CONDITION:  Fair  ACTIVITY:  Activity as tolerated per PT recmmendations  OXYGEN:  Home Oxygen: No.   Oxygen Delivery: room air  DISCHARGE LOCATION:  home   If you experience worsening of your admission symptoms, develop shortness of breath, life threatening emergency, suicidal or homicidal thoughts you must  seek medical attention immediately by calling 911 or calling your MD immediately  if symptoms less severe.  You Must read complete instructions/literature along with all the possible adverse reactions/side effects for all the Medicines you take and that have been prescribed to you. Take any new Medicines after you have completely understood and accpet all the possible adverse reactions/side effects.   Please note  You were cared for by a hospitalist during your hospital stay. If you have any questions about your discharge medications or the care you received while you were in the hospital after you are discharged, you can call the unit and asked to speak with the hospitalist on call if the hospitalist that took care of you is not available. Once you are discharged, your primary care physician will handle any further medical issues. Please note that NO REFILLS for any discharge medications will be authorized once you are discharged, as it is imperative that you return to your primary care physician (or establish a relationship with a primary care physician if you do not have one) for your aftercare needs so  that they can reassess your need for medications and monitor your lab values.     Today  Chief Complaint  Patient presents with  . Fall   Patient is doing fine.  Pain is manageable with p.o. pain medications.  Evaluated by physical therapy who is recommending home health PT and OT.  Patient is agreeable to be discharged and does not want to be transferred to [facility] at this time.  Urinating well last bowel movement 2 days ago.  Patient admits chronic foot numbness from neuropathy Okay to discharge patient from neurosurgery standpoint as per my discussion with Dr. Adriana Simas   ROS: CONSTITUTIONAL: Denies fevers, chills. Denies any fatigue, weakness.  EYES: Denies blurry vision, double vision, eye pain. EARS, NOSE, THROAT: Denies tinnitus, ear pain, hearing loss. RESPIRATORY: Denies cough, wheeze, shortness of breath.  CARDIOVASCULAR: Denies chest pain, palpitations, edema.  GASTROINTESTINAL: Denies nausea, vomiting, diarrhea, abdominal pain. Denies bright red blood per rectum. GENITOURINARY: Denies dysuria, hematuria. ENDOCRINE: Denies nocturia or thyroid problems. HEMATOLOGIC AND LYMPHATIC: Denies easy bruising or bleeding. SKIN: Denies rash or lesion. MUSCULOSKELETAL: Back pain 2 out of 10  nEUROLOGIC: Denies paralysis, paresthesias.  PSYCHIATRIC: Denies anxiety or depressive symptoms.   VITAL SIGNS:  Blood pressure 110/69, pulse 96, temperature 98 F (36.7 C), temperature source Oral, resp. rate 18, height 6\' 2"  (1.88 m), weight 111.4 kg, SpO2 94 %.  I/O:    Intake/Output Summary (Last 24 hours) at 09/09/2018 1153 Last data filed at 09/09/2018 0900 Gross per 24 hour  Intake 480 ml  Output 550 ml  Net -70 ml    PHYSICAL EXAMINATION:  GENERAL:  84 y.o.-year-old patient lying in the bed with no acute distress.  EYES: Pupils equal, round, reactive to light and accommodation. No scleral icterus. Extraocular muscles intact.  HEENT: Head atraumatic, normocephalic. Oropharynx and  nasopharynx clear.  NECK:  Supple, no jugular venous distention. No thyroid enlargement, no tenderness.  LUNGS: Normal breath sounds bilaterally, no wheezing, rales,rhonchi or crepitation. No use of accessory muscles of respiration.  CARDIOVASCULAR: S1, S2 normal. No murmurs, rubs, or gallops.  ABDOMEN: Soft, non-tender, non-distended. Bowel sounds present.  EXTREMITIES: No pedal edema, cyanosis, or clubbing.  Lower back is tender Decreased light touch sensation in distal lower extremities NEUROLOGIC: Awake, alert and oriented x3 sensation intact. Gait not checked.  PSYCHIATRIC: The patient is alert and oriented x 3.  SKIN: No obvious  rash, lesion, or ulcer.   DATA REVIEW:   CBC Recent Labs  Lab 09/08/18 0316  WBC 10.3  HGB 11.7*  HCT 34.8*  PLT 243    Chemistries  Recent Labs  Lab 09/08/18 0316  NA 139  K 4.4  CL 103  CO2 27  GLUCOSE 170*  BUN 30*  CREATININE 0.95  CALCIUM 8.8*  AST 26  ALT 15  ALKPHOS 118  BILITOT 0.7    Cardiac Enzymes No results for input(s): TROPONINI in the last 168 hours.  Microbiology Results  Results for orders placed or performed during the hospital encounter of 09/11/16  C difficile quick scan w PCR reflex     Status: None   Collection Time: 09/10/16  7:00 PM  Result Value Ref Range Status   C Diff antigen NEGATIVE NEGATIVE Final   C Diff toxin NEGATIVE NEGATIVE Final   C Diff interpretation No C. difficile detected.  Final    RADIOLOGY:  Dg Ribs Unilateral W/chest Right  Result Date: 09/07/2018 CLINICAL DATA:  Fall, right posterior rib pain EXAM: RIGHT RIBS AND CHEST - 3+ VIEW COMPARISON:  None. FINDINGS: Lungs are clear.  No pleural effusion or pneumothorax. The heart is normal in size. Right chest pacemaker. Thoracic aortic atherosclerosis. No displaced right rib fracture is seen. IMPRESSION: No evidence of acute cardiopulmonary disease. No displaced right rib fracture is seen. Electronically Signed   By: Charline BillsSriyesh  Krishnan M.D.    On: 09/07/2018 23:15   Ct Chest W Contrast  Result Date: 09/08/2018 CLINICAL DATA:  Trip and fall injury. Left-sided rib pain. EXAM: CT CHEST, ABDOMEN, AND PELVIS WITH CONTRAST TECHNIQUE: Multidetector CT imaging of the chest, abdomen and pelvis was performed following the standard protocol during bolus administration of intravenous contrast. CONTRAST:  125mL ISOVUE-370 IOPAMIDOL (ISOVUE-370) INJECTION 76% COMPARISON:  None. FINDINGS: CT CHEST FINDINGS Cardiovascular: Normal heart size. No pericardial effusions. Coronary artery calcifications. Normal caliber thoracic aorta. Scattered aortic calcifications. Aberrant right subclavian vein. Cardiac pacemaker. Mediastinum/Nodes: No significant lymphadenopathy. Esophagus is decompressed. Lungs/Pleura: Emphysematous changes with mild peripheral fibrosis in the lungs. No airspace disease or consolidation. No pleural effusions. No pneumothorax. Musculoskeletal: Degenerative changes in the thoracic spine. No vertebral compression deformities. Normal alignment. Sternum appears intact. No acute depressed rib fractures identified. CT ABDOMEN PELVIS FINDINGS Hepatobiliary: Cholelithiasis with small stone in the gallbladder. No other inflammatory changes. No bile duct dilatation. No focal liver lesions. No liver laceration. Pancreas: Unremarkable. No pancreatic ductal dilatation or surrounding inflammatory changes. Spleen: Normal in size without focal abnormality. Adrenals/Urinary Tract: No adrenal hemorrhage or renal injury identified. Bladder is unremarkable. Stomach/Bowel: Stomach is within normal limits. Appendix appears normal. No evidence of bowel wall thickening, distention, or inflammatory changes. Diverticulosis of the colon without evidence of diverticulitis. Vascular/Lymphatic: Calcification of the aorta. No aneurysm. No significant lymphadenopathy. Reproductive: Prostate is unremarkable. Other: No free air or free fluid in the abdomen. Musculoskeletal:  Degenerative changes in the lumbar spine. No vertebral compression deformities. There are mildly displaced fractures of the right transverse processes at L1 and L2. There is a hematoma measuring 4.5 x 12.4 cm in the posterior paraspinal musculature at the level of the right eleventh and twelfth ribs. No active contrast extravasation. Edema or bruising in the adjacent subcutaneous fat. Pelvis, sacrum, and hips appear intact. IMPRESSION: 1. Mildly displaced fractures of the right transverse processes at L1 and L2. 2. Hematoma in the posterior paraspinal musculature at the level of the right eleventh and twelfth ribs. No active contrast extravasation.  3. No evidence of mediastinal or pulmonary parenchymal injury. 4. No evidence of solid organ injury or bowel perforation. 5. Cholelithiasis. Electronically Signed   By: Burman Nieves M.D.   On: 09/08/2018 03:54   Ct Abdomen Pelvis W Contrast  Result Date: 09/08/2018 CLINICAL DATA:  Trip and fall injury. Left-sided rib pain. EXAM: CT CHEST, ABDOMEN, AND PELVIS WITH CONTRAST TECHNIQUE: Multidetector CT imaging of the chest, abdomen and pelvis was performed following the standard protocol during bolus administration of intravenous contrast. CONTRAST:  ISOVUE-370 IOPAMIDOL (ISOVUE-370) INJECTION 76% COMPARISON:  None. FINDINGS: CT CHEST FINDINGS Cardiovascular: Normal heart size. No pericardial effusions. Coronary artery calcifications. Normal caliber thoracic aorta. Scattered aortic calcifications. Aberrant right subclavian vein. Cardiac pacemaker. Mediastinum/Nodes: No significant lymphadenopathy. Esophagus is decompressed. Lungs/Pleura: Emphysematous changes with mild peripheral fibrosis in the lungs. No airspace disease or consolidation. No pleural effusions. No pneumothorax. Musculoskeletal: Degenerative changes in the thoracic spine. No vertebral compression deformities. Normal alignment. Sternum appears intact. No acute depressed rib fractures identified.  CT ABDOMEN PELVIS FINDINGS Hepatobiliary: Cholelithiasis with small stone in the gallbladder. No other inflammatory changes. No bile duct dilatation. No focal liver lesions. No liver laceration. Pancreas: Unremarkable. No pancreatic ductal dilatation or surrounding inflammatory changes. Spleen: Normal in size without focal abnormality. Adrenals/Urinary Tract: No adrenal hemorrhage or renal injury identified. Bladder is unremarkable. Stomach/Bowel: Stomach is within normal limits. Appendix appears normal. No evidence of bowel wall thickening, distention, or inflammatory changes. Diverticulosis of the colon without evidence of diverticulitis. Vascular/Lymphatic: Calcification of the aorta. No aneurysm. No significant lymphadenopathy. Reproductive: Prostate is unremarkable. Other: No free air or free fluid in the abdomen. Musculoskeletal: Degenerative changes in the lumbar spine. No vertebral compression deformities. There are mildly displaced fractures of the right transverse processes at L1 and L2. There is a hematoma measuring 4.5 x 12.4 cm in the posterior paraspinal musculature at the level of the right eleventh and twelfth ribs. No active contrast extravasation. Edema or bruising in the adjacent subcutaneous fat. Pelvis, sacrum, and hips appear intact. IMPRESSION: 1. Mildly displaced fractures of the right transverse processes at L1 and L2. 2. Hematoma in the posterior paraspinal musculature at the level of the right eleventh and twelfth ribs. No active contrast extravasation. 3. No evidence of mediastinal or pulmonary parenchymal injury. 4. No evidence of solid organ injury or bowel perforation. 5. Cholelithiasis. Electronically Signed   By: Burman Nieves M.D.   On: 09/08/2018 03:54    EKG:   Orders placed or performed in visit on 05/01/11  . EKG 12-Lead      Management plans discussed with the patient, patient's wife at bedside and they are in agreement.  All their questions were answered to their  satisfaction Guest relations advocate Ms. Clydie Braun was present during the entire encounter  CODE STATUS:     Code Status Orders  (From admission, onward)         Start     Ordered   09/08/18 1120  Full code  Continuous     09/08/18 1119        Code Status History    This patient has a current code status but no historical code status.    Advance Directive Documentation     Most Recent Value  Type of Advance Directive  Healthcare Power of Attorney, Living will  Pre-existing out of facility DNR order (yellow form or pink MOST form)  -  "MOST" Form in Place?  -      TOTAL TIME TAKING  CARE OF THIS PATIENT: 45  minutes.   Note: This dictation was prepared with Dragon dictation along with smaller phrase technology. Any transcriptional errors that result from this process are unintentional.   @MEC @  on 09/09/2018 at 11:53 AM  Between 7am to 6pm - Pager - [phone]  After 6pm go to [email or web address] - password [email or web address] [facility]  [facility]  Office  [phone]  CC: Primary care physician; Dione Housekeeper, MD

## 2018-09-12 NOTE — Care Management (Signed)
Post discharge note entry/late note entry: RNCM has received 4 calls from patient's daughter Colletta [place] [phone] post patient discharge date 09/09/2018 regarding concerns and complaints.  Patient presented to ED on 09/07/18 at 2207 (per note) under observation status.  This RNCM was notified by Tiffiany AD Tuesday 09/09/2018 at approximately 0900AM that patient's daughter Bradd Burner had "a complaint about being told by case management in the ED that patient should bring in his own home meds to take". Lead RNCM and ED RNCM  met with patient and his wife at bedside for clarification and service recovery as the ED RNCM "never told him that".  Santiago Glad with Patient Experience was at patient's bedside when CM team entered room 160. Colletta Maryland had gone home for the day. Patient said "it wasn't the case manager that told him that" and  said "he was dopey and couldn't say 'who' told him what but it was a nurse in the emergency room that told him that (to bring in his home medications)". He had his wife bring in his medications from home and he admits that he took them while he was here "not knowing any better". Daughter Colletta [place] has since called me with multiple complaints since Tuesday, as I left my number with patient and his wife for home health services including private duty agencies AND any further questions he may have.  Stephanie's complaints to me: She states "she request that he be transferred to another hospital while in the ED". She "was told that this was being worked on" and that "someone read it in his chart". During this conversation on 09/11/18. RNCM explained EMTALA and request that she review it for herself online.  RNCM explained that it would be up to patient preference when he arrived to acute care bed and that the Provider would have to find an accepting Provider at the requested facility. She then said that "He now has numbness to his arm which he didn't have and there was no follow up  recommendation to neurology or ortho". Colletta Maryland text this to [facility] on Wednesday, "Just keeps getting better! Looking at his chart. Script for 465m Motrin was never given to him yet his chart says it was! Motrin is listed on his DC Summary but Nothing about how to take it".  RNCM [facility] Unit AD [facility] Another call included her saying that, "Home health agency list provided per CMS.gov did not include [facility] which has the best rating for his Zip code search which was also entered wrong (zip code)". Lead CM request unit CM to check zip code on home health list provided by CMS.gov list  and correct if needed- which was done on 09/09/18. "[facility] told her by phone that they didn't have him on the list to be seen" (this was on Wednesday). I reached out to [employer] follow up with patient. I gave her [facility]'s telephone number. I explained that on the CMS.gov form records [facility] as [facility]old name)- she said [facility] not on the list given. [facility] saw patient for first time on 09/11/18 per daughter. She then called with complaint that "[facility] would not provide personal care to patient and that nursing was not even ordered". Lead RNCM explained that the Provider did not feel that patient had a skill necessary to order an RN and that PT was trained to determine whether or not a RN would be needed.  (This was explained to patient and wife in absence of  daughter by CM on 09/09/18 along with providing a list of agencies for personal care services they could arrange on their own under private pay. When Lead RNCM and ED CM met with patient on Tuesday, the patient seemed upset about "the wait time in the ED- 6 hours; not having any food or water; and pain not being managed".  He could not remember if he called out to nurses station with a request but said "he knew they were busy".  Thursday, daughter Colletta [place] called me expressing "caregiver fatigue" and that she is  considering calling "Joint commission" and "hiring an attorney".  She states she "has received his medical record".  She expresses her disappointment in our facility. I empathized with her fatigue and encouraged her to call me if she needed in other help.  RNCM assured her that her father's chart was being reviewed regarding her concerns and any opportunities (Unit AD, Lead RNCM, and Patient Experience).  RNCM has updated Risk Management Team for assistance with chart review.

## 2019-02-18 ENCOUNTER — Other Ambulatory Visit (HOSPITAL_COMMUNITY): Payer: Self-pay | Admitting: Family Medicine

## 2019-02-18 ENCOUNTER — Other Ambulatory Visit: Payer: Self-pay | Admitting: Family Medicine

## 2019-02-18 DIAGNOSIS — R7989 Other specified abnormal findings of blood chemistry: Secondary | ICD-10-CM

## 2019-02-18 DIAGNOSIS — R1110 Vomiting, unspecified: Secondary | ICD-10-CM

## 2019-02-20 ENCOUNTER — Ambulatory Visit
Admission: RE | Admit: 2019-02-20 | Discharge: 2019-02-20 | Disposition: A | Discharge status: Home / Self Care | Payer: Medicare Other | Source: Ambulatory Visit | Attending: Family Medicine | Admitting: Family Medicine

## 2019-02-20 ENCOUNTER — Other Ambulatory Visit: Payer: Self-pay

## 2019-02-20 DIAGNOSIS — R7989 Other specified abnormal findings of blood chemistry: Secondary | ICD-10-CM

## 2019-02-20 DIAGNOSIS — R1110 Vomiting, unspecified: Secondary | ICD-10-CM | POA: Insufficient documentation

## 2019-02-20 DIAGNOSIS — R945 Abnormal results of liver function studies: Secondary | ICD-10-CM | POA: Diagnosis present

## 2020-01-06 ENCOUNTER — Other Ambulatory Visit: Payer: Self-pay

## 2020-01-06 ENCOUNTER — Ambulatory Visit
Admission: EM | Admit: 2020-01-06 | Discharge: 2020-01-06 | Disposition: A | Discharge status: Home / Self Care | Payer: Medicare Other | Attending: Family Medicine | Admitting: Family Medicine

## 2020-01-06 DIAGNOSIS — L255 Unspecified contact dermatitis due to plants, except food: Secondary | ICD-10-CM | POA: Diagnosis not present

## 2020-01-06 MED ORDER — DEXAMETHASONE SODIUM PHOSPHATE 10 MG/ML IJ SOLN
10.0000 mg | Freq: Once | INTRAMUSCULAR | Status: AC
  Administered 2020-01-06: 10 mg via INTRAMUSCULAR

## 2020-01-06 MED ORDER — TRIAMCINOLONE ACETONIDE 0.1 % EX CREA: 1.0000 "application " | TOPICAL_CREAM | Freq: Two times a day (BID) | CUTANEOUS | 0 refills | Status: DC

## 2020-01-06 NOTE — ED Provider Notes (Signed)
[facility] ____________________________________________  Time seen: Approximately 11:06 AM  I have reviewed the triage vital signs and the nursing notes.   HISTORY  Chief Complaint Poison Ivy   HPI [name] [name] is a 86 y.o. male presenting for evaluation of itchy rash present for the last 10 days.  Patient states rash is to bilateral arms and bilateral legs.  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  States he has had this multiple times with same presentation.  States rash is very itchy and over-the-counter topical Benadryl cream helps.  Denies any other changes in foods, medicines, lotions, detergents or other contacts.  Denies chest pain, shortness of breath, oral or throat swelling, recent tick bites or recent fever.  Denies other aggravating alleviating factors.  Reports otherwise doing well.  Of note discussed with patient he is scheduled to have endoscopy next week.   Dione Housekeeper, MD : PCP  Past Medical History:  Diagnosis Date  . Atrial fibrillation (HCC)   . Atrial fibrillation (HCC)   . GIB (gastrointestinal bleeding)    With colonic AVMs S/P coiling  . Hyperlipidemia   . Hypertension   . Metabolic syndrome   . Paroxysmal supraventricular tachycardia (HCC)   . PMR (polymyalgia rheumatica) (HCC)   . Presence of permanent cardiac pacemaker   . Sick sinus syndrome with tachycardia (HCC)   . Sleep apnea   . Tachycardia     Patient Active Problem List   Diagnosis Date Noted  . Back pain 09/08/2018  . EDEMA 12/07/2009  . METABOLIC SYNDROME X 01/14/2009  . HYPERLIPIDEMIA 12/16/2008  . ATRIAL FIBRILLATION 12/16/2008    Past Surgical History:  Procedure Laterality Date  . CARPAL TUNNEL RELEASE  1982   Right  . CARPAL TUNNEL RELEASE  2000   Left  . COLONOSCOPY WITH PROPOFOL N/A 06/18/2016   Procedure: COLONOSCOPY WITH PROPOFOL;  Surgeon: Scot Jun, MD;  Location: [facility] ENDOSCOPY;  Service: Endoscopy;  Laterality: N/A;  . 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 KNEE SURGERY  1997   Left     No current facility-administered medications for this encounter.  Current Outpatient Medications:  .  acetaminophen (TYLENOL) 325 MG tablet, Take 2 tablets (650 mg total) by mouth every 6 (six) hours as needed for mild pain or headache (or Fever >/= 101)., Disp: , Rfl:  .  aspirin EC 81 MG tablet, Take 81 mg by mouth daily., Disp: , Rfl:  .  ferrous sulfate 325 (65 FE) MG tablet, Take 325 mg by mouth daily., Disp: , Rfl:  .  fish oil-omega-3 fatty acids 1000 MG capsule, Take 1 g by mouth 2 (two) times daily. , Disp: , Rfl:  .  furosemide (LASIX) 40 MG tablet, Take 80 mg by mouth daily. , Disp: , Rfl:  .  ibuprofen (ADVIL,MOTRIN) 400 MG tablet, Take 1 tablet (400 mg total) by mouth every 6 (six) hours as needed for moderate pain., Disp: 30 tablet, Rfl: 0 .  Lutein 6 MG CAPS, Take 1 capsule by mouth daily.  , Disp: , Rfl:  .  metFORMIN (GLUCOPHAGE-XR) 500 MG 24 hr tablet, Take 500 mg by mouth 2 (two) times daily., Disp: , Rfl:  .  metoprolol tartrate (LOPRESSOR) 25 MG tablet, Take 37.5 mg by  mouth 2 (two) times daily., Disp: , Rfl:  .  Multiple Vitamins-Minerals (CENTRUM SILVER PO), Take by mouth daily.  , Disp: , Rfl:  .  Multiple Vitamins-Minerals (ICAPS AREDS 2 PO), Take 1 capsule by mouth daily., Disp: , Rfl:  .  polyethylene glycol (MIRALAX / GLYCOLAX) packet, Take 17 g by mouth daily as needed for moderate constipation., Disp: 14 each, Rfl: 0 .  psyllium (METAMUCIL) 58.6 % powder, Take 1 packet by mouth 2 (two) times daily. , Disp: , Rfl:  .  Psyllium-Calcium (METAMUCIL PLUS CALCIUM) CAPS, Take 2 capsules by mouth daily.  , Disp: , Rfl:  .  rosuvastatin (CRESTOR) 5 MG tablet, Take 2.5 mg by mouth every other day. ,  Disp: , Rfl:  .  Tiotropium Bromide Monohydrate (SPIRIVA RESPIMAT) 2.5 MCG/ACT AERS, Inhale 2 puffs into the lungs daily. , Disp: , Rfl:  .  triamcinolone cream (KENALOG) 0.1 %, Apply 1 application topically 2 (two) times daily. For 7 days, Disp: 30 g, Rfl: 0  Allergies Patient has no known allergies.  Family History  Problem Relation Age of Onset  . Cancer Other   . Coronary artery disease Other   . Other Mother   . Other Father     Social History Social History   Tobacco Use  . Smoking status: Former Smoker    Quit date: 08/27/1974    Years since quitting: 45.3  . Smokeless tobacco: Never Used  Substance Use Topics  . Alcohol use: No  . Drug use: No    Review of Systems Constitutional: No fever ENT: No sore throat. Cardiovascular: Denies chest pain. Respiratory: Denies shortness of breath. Musculoskeletal: Negative for back pain. Skin: Positive for rash.  ____________________________________________   PHYSICAL EXAM:  VITAL SIGNS: ED Triage Vitals  Enc Vitals Group     BP 01/06/20 0900 117/80     Pulse Rate 01/06/20 0900 (!) 101     Resp 01/06/20 0900 16     Temp 01/06/20 0900 98.4 F (36.9 C)     Temp src --      SpO2 01/06/20 0900 98 %     Weight --      Height --      Head Circumference --      Peak Flow --      Pain Score 01/06/20 0901 0     Pain Loc --      Pain Edu? --      Excl. in [place]? --     Constitutional: Alert and oriented. Well appearing and in no acute distress. Eyes: Conjunctivae are normal.  ENT      Head: Normocephalic and atraumatic. Respiratory: Normal respiratory effort without tachypnea nor retractions.  Gastrointestinal: Soft and nontender. No distention. Normal Bowel sounds. No CVA tenderness. Musculoskeletal: Steady gait Neurologic:  Normal speech and language. Speech is normal. No gait instability.  Skin:  Skin is warm, dry except: Pruritic clustered mildly erythematous papular visible rash present to left knee, left shin,  right calf and bilateral forearms, no surrounding erythema, no drainage, no induration. Psychiatric: Mood and affect are normal. Speech and behavior are normal. Patient exhibits appropriate insight and judgment   ___________________________________________   LABS (all labs ordered are listed, but only abnormal results are displayed)  Labs Reviewed - No data to display ____________________________________________  PROCEDURES Procedures     INITIAL IMPRESSION / ASSESSMENT AND PLAN / ED COURSE  Pertinent labs & imaging results that were available during my care of the patient  were reviewed by me and considered in my medical decision making (see chart for details).  Well-appearing patient.  Clinical appearance of rash consistent with contact dermatitis, suspect poison oak or poison ivy.  Patient reports he has done well in the past with oral steroids as well as a "shot of steroids" for the same.  As patient is scheduled for endoscopy in just over a week will treat with 10 milligrams IM Decadron once in urgent care and topical triamcinolone cream.  Supportive care, cool compresses, avoidance of scratching.  Discussed her follow-up and return parameters.  Also discussed follow-up with his gastroenterologist regarding procedure. Discussed indication, risks and benefits of medications with patient.  Discussed follow up and return parameters including no resolution or any worsening concerns. Patient verbalized understanding and agreed to plan.   ____________________________________________   FINAL CLINICAL IMPRESSION(S) / ED DIAGNOSES  Final diagnoses:  Contact dermatitis due to plant     ED Discharge Orders         Ordered    triamcinolone cream (KENALOG) 0.1 %  2 times daily     01/06/20 0936           Note: This dictation was prepared with Dragon dictation along with smaller phrase technology. Any transcriptional errors that result from this process are unintentional.          Renford Dills, NP 01/06/20 1110

## 2020-01-06 NOTE — Discharge Instructions (Addendum)
Use  medication as prescribed. Avoid scratching.   Follow up with your gastroenterologist, as discussed.   Follow up with your primary care physician this week as needed. Return to Urgent care for new or worsening concerns.

## 2020-01-06 NOTE — ED Triage Notes (Signed)
Pt c/o poison ivy rash to bilateral arms and legs x 10 days. Benadryl cream helping with itching at home

## 2020-05-06 ENCOUNTER — Ambulatory Visit
Admission: EM | Admit: 2020-05-06 | Discharge: 2020-05-06 | Disposition: A | Discharge status: Home / Self Care | Payer: Medicare Other | Attending: Family Medicine | Admitting: Family Medicine

## 2020-05-06 ENCOUNTER — Other Ambulatory Visit: Payer: Self-pay

## 2020-05-06 DIAGNOSIS — L255 Unspecified contact dermatitis due to plants, except food: Secondary | ICD-10-CM | POA: Diagnosis not present

## 2020-05-06 MED ORDER — PREDNISONE 10 MG PO TABS: ORAL_TABLET | ORAL | 0 refills | Status: DC

## 2020-05-06 NOTE — ED Triage Notes (Signed)
Pt reports one week of poison ivy on right thumb, both eyebrows, left side and left arm and face

## 2020-05-06 NOTE — ED Provider Notes (Signed)
[facility]    CSN: [id number] Arrival date & time: 05/06/20  1129  History   Chief Complaint Chief Complaint  Patient presents with  . Poison Ivy   HPI  86 year old male presents with the above complaints.  1 week history of rash. Believes he has poison ivy. Location: right thumb, face, left arm, left side. He has tried OTC medication without relief. Associated itching. No other associated symptoms. No other complaints.  Past Medical History:  Diagnosis Date  . Atrial fibrillation (HCC)   . Atrial fibrillation (HCC)   . GIB (gastrointestinal bleeding)    With colonic AVMs S/P coiling  . Hyperlipidemia   . Hypertension   . Metabolic syndrome   . Paroxysmal supraventricular tachycardia (HCC)   . PMR (polymyalgia rheumatica) (HCC)   . Presence of permanent cardiac pacemaker   . Sick sinus syndrome with tachycardia (HCC)   . Sleep apnea   . Tachycardia     Patient Active Problem List   Diagnosis Date Noted  . Back pain 09/08/2018  . EDEMA 12/07/2009  . METABOLIC SYNDROME X 01/14/2009  . HYPERLIPIDEMIA 12/16/2008  . ATRIAL FIBRILLATION 12/16/2008    Past Surgical History:  Procedure Laterality Date  . CARPAL TUNNEL RELEASE  1982   Right  . CARPAL TUNNEL RELEASE  2000   Left  . COLONOSCOPY WITH PROPOFOL N/A 06/18/2016   Procedure: COLONOSCOPY WITH PROPOFOL;  Surgeon: Scot Jun, MD;  Location: [facility] ENDOSCOPY;  Service: Endoscopy;  Laterality: N/A;  . 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 KNEE SURGERY  1997   Left       Home Medications    Prior to Admission medications   Medication Sig Start Date End Date Taking? Authorizing Provider  acetaminophen (TYLENOL) 325 MG tablet Take 2 tablets (650 mg total) by mouth every 6 (six) hours as needed for mild pain or headache (or Fever >/= 101). 09/09/18   Ramonita Lab, MD    aspirin EC 81 MG tablet Take 81 mg by mouth daily.    [provider]  ferrous sulfate 325 (65 FE) MG tablet Take 325 mg by mouth daily.    [provider]  fish oil-omega-3 fatty acids 1000 MG capsule Take 1 g by mouth 2 (two) times daily.     [provider]  furosemide (LASIX) 40 MG tablet Take 80 mg by mouth daily.     [provider]  ibuprofen (ADVIL,MOTRIN) 400 MG tablet Take 1 tablet (400 mg total) by mouth every 6 (six) hours as needed for moderate pain. 09/09/18   Gouru, Deanna Artis, MD  Lutein 6 MG CAPS Take 1 capsule by mouth daily.      [provider]  metFORMIN (GLUCOPHAGE-XR) 500 MG 24 hr tablet Take 500 mg by mouth 2 (two) times daily.    [provider]  metoprolol tartrate (LOPRESSOR) 25 MG tablet Take 37.5 mg by mouth 2 (two) times daily.    [provider]  Multiple Vitamins-Minerals (CENTRUM SILVER PO) Take by mouth daily.      [provider]  Multiple Vitamins-Minerals (ICAPS AREDS 2 PO) Take 1 capsule by mouth daily.    [provider]  polyethylene glycol (MIRALAX / GLYCOLAX) packet Take 17 g by mouth daily as needed for moderate constipation. 09/09/18   Gouru, Deanna Artis, MD  predniSONE (DELTASONE) 10 MG tablet 50  mg daily x 3 days, then 40 mg daily x 3 days, then 30 mg daily x 3 days, then 20 mg daily x 3 days, then 10 mg daily x 3 days. 05/06/20   Tommie Sams, DO  psyllium (METAMUCIL) 58.6 % powder Take 1 packet by mouth 2 (two) times daily.     [provider]  Psyllium-Calcium (METAMUCIL PLUS CALCIUM) CAPS Take 2 capsules by mouth daily.      [provider]  rosuvastatin (CRESTOR) 5 MG tablet Take 2.5 mg by mouth every other day.     [provider]  Tiotropium Bromide Monohydrate (SPIRIVA RESPIMAT) 2.5 MCG/ACT AERS Inhale 2 puffs into the lungs daily.     [provider]  triamcinolone cream (KENALOG) 0.1 % Apply 1 application topically 2 (two) times daily. For 7  days 01/06/20   Renford Dills, NP    Family History Family History  Problem Relation Age of Onset  . Cancer Other   . Coronary artery disease Other   . Other Mother   . Other Father     Social History Social History   Tobacco Use  . Smoking status: Former Smoker    Quit date: 08/27/1974    Years since quitting: 45.7  . Smokeless tobacco: Never Used  Vaping Use  . Vaping Use: Never used  Substance Use Topics  . Alcohol use: No  . Drug use: No     Allergies   Patient has no known allergies.   Review of Systems Review of Systems  Constitutional: Negative.   Skin: Positive for rash.   Physical Exam Triage Vital Signs ED Triage Vitals  Enc Vitals Group     BP 05/06/20 1150 120/72     Pulse Rate 05/06/20 1150 81     Resp 05/06/20 1150 16     Temp 05/06/20 1150 97.7 F (36.5 C)     Temp Source 05/06/20 1150 Oral     SpO2 05/06/20 1150 100 %     Weight 05/06/20 1149 231 lb (104.8 kg)     Height 05/06/20 1149 6\' 2"  (1.88 m)     Head Circumference --      Peak Flow --      Pain Score 05/06/20 1149 0     Pain Loc --      Pain Edu? --      Excl. in [place]? --    Updated Vital Signs BP 120/72 (BP Location: Right Arm)   Pulse 81   Temp 97.7 F (36.5 C) (Oral)   Resp 16   Ht 6\' 2"  (1.88 m)   Wt 104.8 kg   SpO2 100%   BMI 29.66 kg/m   Visual Acuity Right Eye Distance:   Left Eye Distance:   Bilateral Distance:    Right Eye Near:   Left Eye Near:    Bilateral Near:     Physical Exam Constitutional:      General: He is not in acute distress.    Appearance: Normal appearance. He is not ill-appearing.  HENT:     Head: Normocephalic and atraumatic.  Eyes:     General:        Right eye: No discharge.        Left eye: No discharge.     Conjunctiva/sclera: Conjunctivae normal.  Cardiovascular:     Rate and Rhythm: Normal rate and regular rhythm.  Pulmonary:     Effort: Pulmonary effort is normal. No respiratory distress.  Breath sounds: Normal breath  sounds.  Skin:    Comments: Scattered areas of vesicular rash (face, right thumb, left arm).  Neurological:     Mental Status: He is alert.  Psychiatric:        Mood and Affect: Mood normal.        Behavior: Behavior normal.    UC Treatments / Results  Labs (all labs ordered are listed, but only abnormal results are displayed) Labs Reviewed - No data to display  EKG   Radiology No results found.  Procedures Procedures (including critical care time)  Medications Ordered in UC Medications - No data to display  Initial Impression / Assessment and Plan / UC Course  I have reviewed the triage vital signs and the nursing notes.  Pertinent labs & imaging results that were available during my care of the patient were reviewed by me and considered in my medical decision making (see chart for details).    86 year old male presents with dermatitis secondary to poison oak poison ivy. Treating with prednisone.  Final Clinical Impressions(s) / UC Diagnoses   Final diagnoses:  Dermatitis due to plants, including poison ivy, sumac, and oak   Discharge Instructions   None    ED Prescriptions    Medication Sig Dispense Auth. Provider   predniSONE (DELTASONE) 10 MG tablet 50 mg daily x 3 days, then 40 mg daily x 3 days, then 30 mg daily x 3 days, then 20 mg daily x 3 days, then 10 mg daily x 3 days. 45 tablet Tommie Sams, DO     PDMP not reviewed this encounter.   Tommie Sams, [place] 05/07/20 1446

## 2020-11-11 ENCOUNTER — Ambulatory Visit
Admission: EM | Admit: 2020-11-11 | Discharge: 2020-11-11 | Disposition: A | Discharge status: Home / Self Care | Payer: Medicare Other | Attending: Physician Assistant | Admitting: Physician Assistant

## 2020-11-11 ENCOUNTER — Ambulatory Visit (INDEPENDENT_AMBULATORY_CARE_PROVIDER_SITE_OTHER): Payer: Medicare Other

## 2020-11-11 ENCOUNTER — Encounter: Payer: Self-pay | Admitting: Emergency Medicine

## 2020-11-11 ENCOUNTER — Other Ambulatory Visit: Payer: Self-pay

## 2020-11-11 DIAGNOSIS — S60221A Contusion of right hand, initial encounter: Secondary | ICD-10-CM | POA: Diagnosis not present

## 2020-11-11 DIAGNOSIS — M79641 Pain in right hand: Secondary | ICD-10-CM | POA: Diagnosis not present

## 2020-11-11 DIAGNOSIS — S61411A Laceration without foreign body of right hand, initial encounter: Secondary | ICD-10-CM

## 2020-11-11 DIAGNOSIS — R2231 Localized swelling, mass and lump, right upper limb: Secondary | ICD-10-CM

## 2020-11-11 DIAGNOSIS — W010XXA Fall on same level from slipping, tripping and stumbling without subsequent striking against object, initial encounter: Secondary | ICD-10-CM | POA: Diagnosis not present

## 2020-11-11 DIAGNOSIS — W19XXXA Unspecified fall, initial encounter: Secondary | ICD-10-CM | POA: Diagnosis not present

## 2020-11-11 NOTE — Discharge Instructions (Signed)
Your x-ray does not show any fractures or acute abnormalities.  You can treat yourself supportively at home at this time with icing the hand, elevating it and taking Tylenol for pain relief.  You do have 2 very small lacerations so just make sure you keep those clean with soap and water and dry.  You can apply Neosporin and bandages.  Follow-up with our department as needed for any new or worsening symptoms.  If you suddenly develop severe headache, vision changes, dizziness, weakness, confusion, vomiting, let someone know, call 911 or go to emergency department.

## 2020-11-11 NOTE — ED Triage Notes (Signed)
Patient in today c/o right hand injury after falling in his yard today. Patient states he fell and tried to catch himself on his hands.

## 2020-11-11 NOTE — ED Provider Notes (Signed)
[facility]    CSN: [id number] Arrival date & time: 11/11/20  1734      History   Chief Complaint Chief Complaint  Patient presents with  . Fall    DOI 11/11/20  . Hand Injury    right    HPI [name] [name] is a 87 y.o. male presenting for right hand pain following an accidental fall a couple of hours ago.  Patient states that he was doing yard work with a leaf blower and says that he got his legs twisted up in some vines.  He says that he started to fall in reached his hands out in front of him to brace his fall.  He is had swelling, bruising and pain of the right hand since the fall.  He denies ever hitting his head or losing consciousness.  Patient only admits to injury to the hand and denies any other injuries.  He denies any associated numbness, weakness or tingling.  Patient states that he does have chronic problems with carpal tunnel syndrome and states that he has had 2 surgeries on his hands.  He says that has been flaring up recently.  Patient has a couple of small lacerations.  He has not iced the hand or take anything for pain relief.  Past medical history is significant for A. fib, hypertension, and hyperlipidemia.  He does have a pacemaker.  He is not taking any anticoagulant medications.  He has no other concerns.  HPI  Past Medical History:  Diagnosis Date  . Atrial fibrillation (HCC)   . Atrial fibrillation (HCC)   . GIB (gastrointestinal bleeding)    With colonic AVMs S/P coiling  . Hyperlipidemia   . Hypertension   . Metabolic syndrome   . Paroxysmal supraventricular tachycardia (HCC)   . PMR (polymyalgia rheumatica) (HCC)   . Presence of permanent cardiac pacemaker   . Sick sinus syndrome with tachycardia (HCC)   . Sleep apnea   . Tachycardia     Patient Active Problem List   Diagnosis Date Noted  . Back pain 09/08/2018  . EDEMA 12/07/2009  . METABOLIC SYNDROME X 01/14/2009  . HYPERLIPIDEMIA 12/16/2008  . ATRIAL FIBRILLATION  12/16/2008    Past Surgical History:  Procedure Laterality Date  . CARPAL TUNNEL RELEASE  1982   Right  . CARPAL TUNNEL RELEASE  2000   Left  . COLONOSCOPY WITH PROPOFOL N/A 06/18/2016   Procedure: COLONOSCOPY WITH PROPOFOL;  Surgeon: Scot Junobert T Elliott, MD;  [facility];  Service: Endoscopy;  Laterality: N/A;  . 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 KNEE SURGERY  1997   Left       Home Medications    Prior to Admission medications   Medication Sig Start Date End Date Taking? Authorizing Provider  acetaminophen (TYLENOL) 325 MG tablet Take 2 tablets (650 mg total) by mouth every 6 (six) hours as needed for mild pain or headache (or Fever >/= 101). 09/09/18  Yes Gouru, Deanna ArtisAruna, MD  aspirin EC 81 MG tablet Take 81 mg by mouth daily.   Yes [provider]  ferrous sulfate 325 (65 FE) MG tablet Take 325 mg by mouth daily.   Yes [provider]  fish oil-omega-3 fatty acids 1000 MG capsule Take 1 g by mouth 2 (two) times daily.   Yes [provider]  furosemide (LASIX) 40 MG tablet  Take 80 mg by mouth daily.    Yes [provider]  ibuprofen (ADVIL,MOTRIN) 400 MG tablet Take 1 tablet (400 mg total) by mouth every 6 (six) hours as needed for moderate pain. 09/09/18  Yes Gouru, Deanna Artis, MD  Lutein 6 MG CAPS Take 1 capsule by mouth daily.   Yes [provider]  metFORMIN (GLUCOPHAGE-XR) 500 MG 24 hr tablet Take 500 mg by mouth 2 (two) times daily.   Yes [provider]  metoprolol tartrate (LOPRESSOR) 25 MG tablet Take 37.5 mg by mouth 2 (two) times daily.   Yes [provider]  Multiple Vitamins-Minerals (CENTRUM SILVER PO) Take by mouth daily.   Yes [provider]  polyethylene glycol (MIRALAX / GLYCOLAX) packet Take 17 g by mouth daily as needed for moderate constipation. 09/09/18  Yes  Gouru, Aruna, MD  psyllium (METAMUCIL) 58.6 % powder Take 1 packet by mouth 2 (two) times daily.    Yes [provider]  rosuvastatin (CRESTOR) 5 MG tablet Take 2.5 mg by mouth every other day.    Yes [provider]  Multiple Vitamins-Minerals (ICAPS AREDS 2 PO) Take 1 capsule by mouth daily.    [provider]  predniSONE (DELTASONE) 10 MG tablet 50 mg daily x 3 days, then 40 mg daily x 3 days, then 30 mg daily x 3 days, then 20 mg daily x 3 days, then 10 mg daily x 3 days. 05/06/20   Cook, Verdis Frederickson, DO  Psyllium-Calcium (METAMUCIL PLUS CALCIUM) CAPS Take 2 capsules by mouth daily.    [provider]  Tiotropium Bromide Monohydrate (SPIRIVA RESPIMAT) 2.5 MCG/ACT AERS Inhale 2 puffs into the lungs daily.     [provider]  triamcinolone cream (KENALOG) 0.1 % Apply 1 application topically 2 (two) times daily. For 7 days 01/06/20   Renford Dills, NP    Family History Family History  Problem Relation Age of Onset  . Cancer Other   . Coronary artery disease Other   . Heart disease Mother   . Cancer Father        lymphatic    Social History Social History   Tobacco Use  . Smoking status: Former Smoker    Quit date: 08/27/1974    Years since quitting: 46.2  . Smokeless tobacco: Never Used  Vaping Use  . Vaping Use: Never used  Substance Use Topics  . Alcohol use: No  . Drug use: No     Allergies   Simvastatin, Atorvastatin, and Warfarin   Review of Systems Review of Systems  Constitutional: Negative for fatigue.  Eyes: Negative for photophobia and visual disturbance.  Cardiovascular: Negative for chest pain and palpitations.  Gastrointestinal: Negative for nausea and vomiting.  Musculoskeletal: Positive for arthralgias. Negative for joint swelling.  Skin: Negative for color change and wound.  Neurological: Negative for dizziness, syncope, weakness and headaches.     Physical Exam Triage Vital Signs ED Triage Vitals  Enc  Vitals Group     BP 11/11/20 1742 122/62     Pulse Rate 11/11/20 1742 89     Resp 11/11/20 1742 18     Temp 11/11/20 1742 97.7 F (36.5 C)     Temp Source 11/11/20 1742 Oral     SpO2 11/11/20 1742 100 %     Weight 11/11/20 1743 235 lb (106.6 kg)     Height 11/11/20 1743 6\' 2"  (1.88 m)     Head Circumference --      Peak Flow --  Pain Score 11/11/20 1743 5     Pain Loc --      Pain Edu? --      Excl. in GC? --    No data found.  Updated Vital Signs BP 122/62 (BP Location: Left Arm)   Pulse 89   Temp 97.7 F (36.5 C) (Oral)   Resp 18   Ht 6\' 2"  (1.88 m)   Wt 235 lb (106.6 kg)   SpO2 100%   BMI 30.17 kg/m    Physical Exam Vitals and nursing note reviewed.  Constitutional:      General: He is not in acute distress.    Appearance: Normal appearance. He is well-developed. He is not ill-appearing.  HENT:     Head: Normocephalic and atraumatic.  Eyes:     General: No scleral icterus.    Conjunctiva/sclera: Conjunctivae normal.     Pupils: Pupils are equal, round, and reactive to light.  Cardiovascular:     Rate and Rhythm: Normal rate and regular rhythm.     Heart sounds: Normal heart sounds.  Pulmonary:     Effort: Pulmonary effort is normal. No respiratory distress.     Breath sounds: Normal breath sounds.  Musculoskeletal:        General: Swelling and tenderness (TTP diffusely througout dorsal hand with most tenderness about the 4th and 5th metacarpal bones ) present.     Cervical back: Neck supple.  Skin:    General: Skin is warm and dry.     Findings: Bruising (diffuse ecchymosis and swelling right dorsal hand) and lesion (1 superficial abrasion aboud 6 mm palm. Small superficial skin tear dorsal hand) present.  Neurological:     General: No focal deficit present.     Mental Status: He is alert. Mental status is at baseline.  Psychiatric:        Mood and Affect: Mood normal.        Behavior: Behavior normal.        Thought Content: Thought content normal.       UC Treatments / Results  Labs (all labs ordered are listed, but only abnormal results are displayed) Labs Reviewed - No data to display  EKG   Radiology DG Hand Complete Right  Result Date: 11/11/2020 CLINICAL DATA:  Fall with pain, bruising and swelling. Right hand injury. Fall in the yard today. EXAM: RIGHT HAND - COMPLETE 3+ VIEW COMPARISON:  None. FINDINGS: No evidence of acute fracture. Well corticated density just distal to the third digit distal tuft is likely chronic. No dislocation. Advanced osteoarthritis at the thumb carpal metacarpal joint with joint space narrowing, subchondral cystic change, osteophytes and fragmentation. Mild osteoarthritis involving the metacarpal phalangeal joints. No erosion or bony destruction. Mild generalized soft tissue edema. No radiopaque foreign body. IMPRESSION: 1. No acute fracture or dislocation of the right hand. 2. Osteoarthritis, most prominent at the thumb carpometacarpal joint. 3. Soft tissue edema. Electronically Signed   By: 11/13/2020 M.D.   On: 11/11/2020 18:24    Procedures Procedures (including critical care time)  Medications Ordered in UC Medications - No data to display  Initial Impression / Assessment and Plan / UC Course  I have reviewed the triage vital signs and the nursing notes.  Pertinent labs & imaging results that were available during my care of the patient were reviewed by me and considered in my medical decision making (see chart for details).   87 year old male presenting for right hand injury following a traumatic fall accidental  fall today.  Vital signs are all normal and stable.  Exam significant for diffuse ecchymosis and swelling of the right dorsal hand.  He does have 1 small laceration and 1 small skin tear.  Both of the wounds are very superficial.  Patient unsure of his last tetanus and declines getting that updated today.  I cleaned the wounds with alcohol and covered with Band-Aids.  He  does have tenderness throughout the fourth and fifth metacarpal bones.  No wrist tenderness and good range of motion of the wrist.  X-ray right hand obtained today.  I did independently review the x-ray.  X-ray does not reveal any acute abnormalities.  Reviewed results of the hand x-ray with patient.  Advised supportive care with local cryotherapy and Tylenol for pain relief.  Discussed local wound care.  Advised to follow-up with our clinic as needed.  ED precautions for falls discussed with patient.   Final Clinical Impressions(s) / UC Diagnoses   Final diagnoses:  Contusion of right hand, initial encounter  Fall, initial encounter  Laceration of right hand, foreign body presence unspecified, initial encounter  Skin tear of right hand without complication, initial encounter     Discharge Instructions     Your x-ray does not show any fractures or acute abnormalities.  You can treat yourself supportively at home at this time with icing the hand, elevating it and taking Tylenol for pain relief.  You do have 2 very small lacerations so just make sure you keep those clean with soap and water and dry.  You can apply Neosporin and bandages.  Follow-up with our department as needed for any new or worsening symptoms.  If you suddenly develop severe headache, vision changes, dizziness, weakness, confusion, vomiting, let someone know, call 911 or go to emergency department.    ED Prescriptions    None     PDMP not reviewed this encounter.   Shirlee Latch, PA-C 11/11/20 [phone]

## 2021-04-12 ENCOUNTER — Ambulatory Visit: Payer: Medicare Other | Attending: Critical Care Medicine | Admitting: Physical Therapy

## 2021-04-12 ENCOUNTER — Encounter: Payer: Self-pay | Admitting: Physical Therapy

## 2021-04-12 ENCOUNTER — Other Ambulatory Visit: Payer: Self-pay

## 2021-04-12 VITALS — BP 117/73 | HR 83

## 2021-04-12 DIAGNOSIS — R2689 Other abnormalities of gait and mobility: Secondary | ICD-10-CM | POA: Diagnosis not present

## 2021-04-12 DIAGNOSIS — R262 Difficulty in walking, not elsewhere classified: Secondary | ICD-10-CM | POA: Diagnosis present

## 2021-04-12 DIAGNOSIS — R0602 Shortness of breath: Secondary | ICD-10-CM | POA: Insufficient documentation

## 2021-04-12 NOTE — Therapy (Signed)
[facility] [address] [place], [place], [address] Phone: [phone]   Fax:  [phone]  Physical Therapy Evaluation  Patient Details  Name: [name] [name] MRN: [id number] Date of Birth: 03-25-1932 Referring Provider (PT): Holley Raring, MD  Encounter Date: 04/12/2021   PT End of Session - 04/13/21 1300     Visit Number 1    Number of Visits 17    Date for PT Re-Evaluation 06/07/21    Authorization Type VL: based on medical necessity    Authorization - Visit Number 1    Progress Note Due on Visit 10    PT Start Time 1102    PT Stop Time 1145    PT Time Calculation (min) 43 min    Equipment Utilized During Treatment Gait belt    Activity Tolerance Patient tolerated treatment well;No increased pain    Behavior During Therapy WFL for tasks assessed/performed             Past Medical History:  Diagnosis Date   Atrial fibrillation (HCC)    Atrial fibrillation (HCC)    GIB (gastrointestinal bleeding)    With colonic AVMs S/P coiling   Hyperlipidemia    Hypertension    Metabolic syndrome    Paroxysmal supraventricular tachycardia (HCC)    PMR (polymyalgia rheumatica) (HCC)    Presence of permanent cardiac pacemaker    Sick sinus syndrome with tachycardia (HCC)    Sleep apnea    Tachycardia     Past Surgical History:  Procedure Laterality Date   CARPAL TUNNEL RELEASE  1982   Right   CARPAL TUNNEL RELEASE  2000   Left   COLONOSCOPY WITH PROPOFOL N/A 06/18/2016   Procedure: COLONOSCOPY WITH PROPOFOL;  Surgeon: Scot Jun, MD;  Location: [facility] ENDOSCOPY;  Service: Endoscopy;  Laterality: N/A;   FECAL TRANSPLANT N/A 06/18/2016   Procedure: FECAL TRANSPLANT;  Surgeon: Scot Jun, MD;  Location: [facility] ENDOSCOPY;  Service: Endoscopy;  Laterality: N/A;   KNEE SURGERY  1997   Left    Vitals:   04/12/21 1121  BP: 117/73  Pulse: 83  SpO2: 96%      Subjective Assessment - 04/13/21 1302      Subjective Patient is an 87 year old male with primary complaint of balance issues, decreased confidence with walking, shortness of breath    Pertinent History Pt is an 87 year old male with history of COPD, some evidence of radiographic pulmonary fibrosis, peripheral neuropathy. Pt reports history of neuropathy affecting both of his feet. He feels like he is walking on a pad. He reports decreased confidence with walking. He states that he will drag one of his toes and this may lead to LOB. Patient reports mild shortness of breath with activity. He uses inhaler to improve activity endurance. Patient reports no falls in last 6 months. He reports several falls prior to the last 6 months; he sustained fracture of L1 and L2 following fall in Jan 2020. Patient reports involved orthopedic history with bilateral knee pain; hx of L knee arthroscopy in 1997. Patient reports having more falls for several years, > 5 years. Patient reports he is falling mainly due to dragging his R toe. He has an elevator in his garage to get up to 1st level. His only rug is outside of shower. Pt has stand-up, walk-in shower. Pt has grab bars and non-slip surface. Hx of skin cancer - removed 4-5 years ago ("they got it all the first time"). Pt has  stable pulmonary function tests. 6-minute walk test 329 meters on 04/03/21.  No directional pattern for falls. No red flags.    Limitations Walking;House hold activities    Diagnostic tests PFTs stable    Patient Stated Goals Able to walk better without loss of balance                [facility] PT Assessment - 04/13/21 1257       Assessment   Medical Diagnosis Shortness of breath, Balance problem    Referring Provider (PT) Holley Raring, MD    Onset Date/Surgical Date 04/13/16    Next MD Visit None scheduled    Prior Therapy 3-4 years ago      Precautions   Precautions Fall      Balance Screen   Has the patient fallen in the past 6 months No    Has the patient had a decrease in  activity level because of a fear of falling?  No    Is the patient reluctant to leave their home because of a fear of falling?  No      Prior Function   Level of Independence Independent with community mobility with device   Uses Nordic walking pole     Cognition   Overall Cognitive Status Within Functional Limits for tasks assessed                     SUBJECTIVE Chief complaint: Balance issues, decreased confidence with walking, shortness of breath Onset: Pt is an 87 year old male with history of COPD, some evidence of radiographic pulmonary fibrosis, peripheral neuropathy. Pt reports history of neuropathy affecting both of his feet. He feels like he is walking on a pad. He reports decreased confidence with walking. He states that he will drag one of his toes and this may lead to LOB. Patient reports mild shortness of breath with activity. He uses inhaler to improve activity endurance. Patient reports no falls in last 6 months. He reports several falls prior to the last 6 months; he sustained fracture of L1 and L2 following fall in Jan 2020. Patient reports involved orthopedic history with bilateral knee pain; hx of L knee arthroscopy in 1997. Patient reports having more falls for several years, > 5 years. Patient reports he is falling mainly due to dragging his R toe. He has an elevator in his garage to get up to 1st level. His only rug is outside of shower. Pt has stand-up, walk-in shower. Pt has grab bars and non-slip surface. Hx of skin cancer - removed 4-5 years ago ("they got it all the first time"). Pt has stable pulmonary function tests. 6-minute walk test 329 meters on 04/03/21.   Recent changes in overall health/medication: No Directional pattern for falls: None Prior history of physical therapy for balance: 3-4 years ago Follow-up appointment with MD: Annual visits with referring provider only  Red flags (bowel/bladder changes, saddle paresthesia, personal history of  cancer, chills/fever, night sweats, unrelenting pain) Negative     OBJECTIVE  MUSCULOSKELETAL: Tremor: Absent Bulk: Normal Tone: Normal, no clonus  Posture Mild increased thoracic kyphosis, sacral sitting  Gait Mild dec step length, dec single-limb support time, mild steppage gait pattern to clear toe bilaterally  Strength R/L 4/4 Hip flexion 4/4 Hip external rotation 4+/4+ Hip internal rotation 5/5 Hip abduction (seated) 5/5 Hip adduction 5/5 Knee extension 5/5 Knee flexion 5/5 Ankle Plantarflexion 5/4+ Ankle Dorsiflexion  Flexibility Gastrocnemius: R Poor, L Poor   NEUROLOGICAL:  Mental Status  Patient is oriented to person, place and time.  Recent memory is intact.  Remote memory is intact.  Attention span and concentration are intact.  Expressive speech is intact.  Patient's fund of knowledge is within normal limits for educational level.  Sensation Mild altered sensation along medial malleoli and great toe/medial longitudinal arch  Proprioception and hot/cold testing deferred on this date  Reflexes Deferred  Coordination/Cerebellar Finger to Nose: WNL Heel to Shin: WNL Dysdiadochokinesia: WNL Finger Opposition: WNL Pronator Drift: Negative   FUNCTIONAL OUTCOME MEASURES   Results Comments  BERG  Will perform next visit  TUG 11 seconds   5TSTS 18 seconds   6 Minute Walk Test  Will perform next visit    POSTURAL CONTROL TESTS   Modified Clinical Test of Sensory Interaction for Balance    (CTSIB):  CONDITION TIME STRATEGY SWAY  Eyes open, firm surface 30 seconds ankle WNL  Eyes closed, firm surface 30 seconds ankle WNL  Eyes open, foam surface Next visit ankle   Eyes closed, foam surface Next visit ankle      Objective measurements completed on examination: See above findings.     ASSESSMENT Clinical Impression: Pt is a pleasant 87 year old male referred for difficulty walking, imbalance, and shortness of breath with history of  bilateral peripheral neuropathy, COPD, radiographic evidence of pulmonary fibrosis, atrial fibrillation, and history of several GI bleeds. Good vital signs this afternoon. PT examination reveals deficits in hip strength, gait deviations (compensation for decreased toe clearance), distal bilateral lower extremity sensory deficits, postural stability deficits, and postural changes. These impairments limited the patient's ability to perform safe community-level ambulation, negotiating uneven surfaces, and obstacle negotiation. Pt will benefit from skilled PT services to address deficits in balance and decrease risk for future falls.        PT Short Term Goals - 04/13/21 1320       PT SHORT TERM GOAL #1   Title Pt will be independent with HEP in order to improve strength, aerobic endurance, and balance in order to decrease fall risk and improve function at home and work.    Baseline 04/12/21: No formal HEP established    Time 3    Period Weeks    Status New    Target Date 05/03/21               PT Long Term Goals - 04/12/21 1249       PT LONG TERM GOAL #1   Title Patient will demonstrate improved function as evidenced by a score of 52 on FOTO measure for full participation in activities at home and in the community.    Baseline 04/12/21: FOTO 50    Time 8    Period Weeks    Status New    Target Date 06/07/21      PT LONG TERM GOAL #2   Title Pt will improve BERG by at least 3 points in order to demonstrate clinically significant improvement in balance.    Baseline 04/12/21: BERG to be obtained next visit    Time 8    Period Weeks    Status New    Target Date 06/07/21      PT LONG TERM GOAL #3   Title Pt will decrease 5TSTS by at least 3 seconds in order to demonstrate clinically significant improvement in LE strength.    Baseline 04/12/21: 18 seconds    Time 8    Period Weeks    Status New    Target Date  06/07/21      PT LONG TERM GOAL #4   Title Pt will increase by  at least 62m (132ft) in order to demonstrate clinically significant improvement in cardiopulmonary endurance and community ambulation    Baseline 04/12/21: to be obtained next visit    Time 8    Period Weeks    Status New    Target Date 06/07/21      PT LONG TERM GOAL #5   Title Patient will demonstrate no foot drop or decreased toe clearance during performance of 6-minute walk test and no other major gait deviation predisposing patient to increased fall risk    Baseline 04/12/21: complaint of decreased toe clearance, tripping during swing phase    Time 8    Period Weeks    Status New    Target Date 06/07/21                    Plan - 04/13/21 1315     Clinical Impression Statement Clinical Impression: Pt is a pleasant 87 year old male referred for difficulty walking, imbalance, and shortness of breath with history of bilateral peripheral neuropathy, COPD, radiographic evidence of pulmonary fibrosis, atrial fibrillation, and history of several GI bleeds. Good vital signs this afternoon. PT examination reveals deficits in hip strength, gait deviations (compensation for decreased toe clearance), distal bilateral lower extremity sensory deficits, postural stability deficits, and postural changes. These impairments limited the patient's ability to perform safe community-level ambulation, negotiating uneven surfaces, and obstacle negotiation. Pt will benefit from skilled PT services to address deficits in balance and decrease risk for future falls.    Personal Factors and Comorbidities Comorbidity 3+    Comorbidities COPD, atrial fibrillation, GI bleeds, HTN, hyperlipidemia, presence of cardiac pacemaker    Examination-Activity Limitations Stairs;Locomotion Level;Transfers    Examination-Participation Restrictions Community Activity;Shop;Yard Work    Conservation officer, historic buildings Evolving/Moderate complexity    Clinical Decision Making Moderate    Rehab Potential Good    PT  Frequency 2x / week    PT Duration 8 weeks    PT Treatment/Interventions Gait training;Stair training;Therapeutic activities;Therapeutic exercise;Balance training;Neuromuscular re-education;Patient/family education    PT Next Visit Plan and BERG performance, aerobic conditioning with monitoring of O2, balance and gait training    PT Home Exercise Plan Formal HEP next visit    Consulted and Agree with Plan of Care Patient             Patient will benefit from skilled therapeutic intervention in order to improve the following deficits and impairments:  Abnormal gait, Decreased endurance, Decreased activity tolerance, Impaired sensation, Difficulty walking, Decreased balance, Cardiopulmonary status limiting activity, Impaired flexibility  Visit Diagnosis: Imbalance  Difficulty in walking, not elsewhere classified  Shortness of breath     Problem List Patient Active Problem List   Diagnosis Date Noted   Back pain 09/08/2018   EDEMA 12/07/2009   METABOLIC SYNDROME X 01/14/2009   HYPERLIPIDEMIA 12/16/2008   ATRIAL FIBRILLATION 12/16/2008   Consuela Mimes, PT, DPT #[id number]  Gertie Exon 04/13/2021, 6:15 PM  [facility] [address] [place], [place], [address] Phone: [phone]   Fax:  [phone]  Name: [name] [name] MRN: [id number] Date of Birth: [birth year]

## 2021-04-14 NOTE — Addendum Note (Signed)
Addended by: Consuela Mimes T on: 04/14/2021 10:21 AM   Modules accepted: Orders

## 2021-04-17 ENCOUNTER — Other Ambulatory Visit: Payer: Self-pay

## 2021-04-17 ENCOUNTER — Encounter: Payer: Self-pay | Admitting: Physical Therapy

## 2021-04-17 ENCOUNTER — Ambulatory Visit: Payer: Medicare Other | Admitting: Physical Therapy

## 2021-04-17 VITALS — BP 121/70 | HR 84

## 2021-04-17 DIAGNOSIS — R2689 Other abnormalities of gait and mobility: Secondary | ICD-10-CM

## 2021-04-17 DIAGNOSIS — R0602 Shortness of breath: Secondary | ICD-10-CM

## 2021-04-17 DIAGNOSIS — R262 Difficulty in walking, not elsewhere classified: Secondary | ICD-10-CM

## 2021-04-17 NOTE — Therapy (Signed)
[facility] [address] [place], [place], [address] Phone: [phone]   Fax:  [phone]  Physical Therapy Treatment  Patient Details  Name: [name] [name] MRN: [id number] Date of Birth: [birth year] Referring Provider (PT): Holley Raring, MD   Encounter Date: 04/17/2021   PT End of Session - 04/17/21 1322     Visit Number 2    Number of Visits 17    Date for PT Re-Evaluation 06/07/21    Authorization Type VL: based on medical necessity    Authorization - Visit Number 2    Progress Note Due on Visit 10    PT Start Time 1105    PT Stop Time 1146    PT Time Calculation (min) 41 min    Equipment Utilized During Treatment Gait belt    Activity Tolerance Patient tolerated treatment well;No increased pain    Behavior During Therapy WFL for tasks assessed/performed             Past Medical History:  Diagnosis Date   Atrial fibrillation (HCC)    Atrial fibrillation (HCC)    GIB (gastrointestinal bleeding)    With colonic AVMs S/P coiling   Hyperlipidemia    Hypertension    Metabolic syndrome    Paroxysmal supraventricular tachycardia (HCC)    PMR (polymyalgia rheumatica) (HCC)    Presence of permanent cardiac pacemaker    Sick sinus syndrome with tachycardia (HCC)    Sleep apnea    Tachycardia     Past Surgical History:  Procedure Laterality Date   CARPAL TUNNEL RELEASE  1982   Right   CARPAL TUNNEL RELEASE  2000   Left   COLONOSCOPY WITH PROPOFOL N/A 06/18/2016   Procedure: COLONOSCOPY WITH PROPOFOL;  Surgeon: Scot Jun, MD;  Location: [facility] ENDOSCOPY;  Service: Endoscopy;  Laterality: N/A;   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 He reports no major LOB or other significant incidents since last visit. He reports no major complaints after his IE. Patient reports no other updates at arrival to PT.    Pertinent History Pt is an 87 year old male with history of COPD, some evidence of radiographic pulmonary fibrosis, peripheral neuropathy. Pt reports history of neuropathy affecting both of his feet. He feels like he is walking on a pad. He reports decreased confidence with walking. He states that he will drag one of his toes and this may lead to LOB. Patient reports mild shortness of breath with activity. He uses inhaler to improve activity endurance. Patient reports no falls in last 6 months. He reports several falls prior to the last 6 months; he sustained fracture of L1 and L2 following fall in Jan 2020. Patient reports involved orthopedic history with bilateral knee pain; hx of L knee arthroscopy in 1997. Patient reports having more falls for several years, > 5 years. Patient reports he is falling mainly due to dragging his R toe. He has an elevator in his garage to get up to 1st level. His only rug is outside of shower. Pt has stand-up, walk-in shower. Pt has  grab bars and non-slip surface. Hx of skin cancer - removed 4-5 years ago ("they got it all the first time"). Pt has stable pulmonary function tests. 6-minute walk test 329 meters on 04/03/21.  No directional pattern for falls. No red flags.    Limitations Walking;House hold activities    Diagnostic tests PFTs stable    Patient Stated Goals Able to walk better without loss of balance                 FUNCTIONAL OUTCOME MEASURES       Results Comments  BERG  45/56 Moderate fall risk  TUG 11 seconds    5TSTS 18 seconds  Fall risk, in need of intervention  6 Minute Walk Test  1207.5 ft        POSTURAL CONTROL TESTS            Modified Clinical Test of Sensory Interaction for Balance     (CTSIB):    CONDITION TIME STRATEGY SWAY  Eyes open, firm surface 30 seconds ankle WNL  Eyes closed, firm surface 30 seconds ankle WNL  Eyes open, foam surface 30 seconds ankle WNL   Eyes closed, foam surface 30 seconds ankle  increased        TREATMENT  Physical Performance Testing -Performance of BERG, , Modified CTSIB (see above) -Patient education on results of findings and role of PT   Neuromuscular Re-education - for nervous system downregulation, gluteal musculature activation and exercises to promote LE kinetic chain stability  Airex stand, eyes open 30 sec, eyes closed 30 sec Rhomberg with eyes closed; 2x30sec Heel to toe stepping with toe clearance on agility ladder, blue line as target; 3x D/B    ASSESSMENT Patient arrives with good vital signs and no concerning changes in oxygen saturation or heart rate throughout session today. He has clinically meaningful improvement in 6-minute walk test relative to his distance obtained from referral source's documentation. He exhibits good stability on unstable surface in static standing with eyes closed. He is at established cut-off score for BERG with "moderate risk"  per score interpretation. He has comorbid R knee pain for which he has undergone injections; he is continuing follow-up with orthopedist for this condition. He is not notably limited in performance secondary to knee pain and does not have notable complaint during performance testing today. Patient has remaining deficits in static and dynamic postural stability, gait changes/dec toe clearance, visual dependence for sensory integration, hip flexor and gluteal strength. Patient will benefit from continued skilled therapeutic intervention to address the above deficits as needed for improved function and QoL.      PT Short Term Goals - 04/13/21 1320       PT SHORT TERM GOAL #1   Title Pt will be independent with HEP in order to improve strength, aerobic endurance,  and balance in order to decrease fall risk and improve function at home and work.    Baseline 04/12/21: No formal HEP established    Time 3    Period Weeks    Status New    Target Date 05/03/21               PT Long Term Goals - 04/12/21 1249       PT LONG TERM GOAL #1   Title Patient will demonstrate improved function as evidenced by a score of 52 on FOTO measure for full participation in activities at home and in the community.    Baseline 04/12/21: FOTO 50  Time 8    Period Weeks    Status New    Target Date 06/07/21      PT LONG TERM GOAL #2   Title Pt will improve BERG by at least 3 points in order to demonstrate clinically significant improvement in balance.    Baseline 04/12/21: BERG to be obtained next visit    Time 8    Period Weeks    Status New    Target Date 06/07/21      PT LONG TERM GOAL #3   Title Pt will decrease 5TSTS by at least 3 seconds in order to demonstrate clinically significant improvement in LE strength.    Baseline 04/12/21: 18 seconds    Time 8    Period Weeks    Status New    Target Date 06/07/21      PT LONG TERM GOAL #4   Title Pt will increase 6MWT by at least 4337m (14964ft) in order to demonstrate clinically significant improvement in cardiopulmonary endurance and community ambulation    Baseline 04/12/21: to be obtained next visit    Time 8    Period Weeks    Status New    Target Date 06/07/21      PT LONG TERM GOAL #5   Title Patient will demonstrate no foot drop or decreased toe clearance during performance of 6-minute walk test and no other major gait deviation predisposing patient to increased fall risk    Baseline 04/12/21: complaint of decreased toe clearance, tripping during swing phase    Time 8    Period Weeks    Status New    Target Date 06/07/21                   Plan - 04/18/21 1422     Clinical Impression Statement Patient arrives with good vital signs and no concerning changes in oxygen saturation or heart  rate throughout session today. He has clinically meaningful improvement in 6-minute walk test relative to his distance obtained from referral source's documentation. He exhibits good stability on unstable surface in static standing with eyes closed. He is at established cut-off score for BERG with "moderate risk"  per score interpretation. He has comorbid R knee pain for which he has undergone injections; he is continuing follow-up with orthopedist for this condition. He is not notably limited in performance secondary to knee pain and does not have notable complaint during performance testing today. Patient has remaining deficits in static and dynamic postural stability, gait changes/dec toe clearance, visual dependence for sensory integration, hip flexor and gluteal strength. Patient will benefit from continued skilled therapeutic intervention to address the above deficits as needed for improved function and QoL.    Personal Factors and Comorbidities Comorbidity 3+    Comorbidities COPD, atrial fibrillation, GI bleeds, HTN, hyperlipidemia, presence of cardiac pacemaker    Examination-Activity Limitations Stairs;Locomotion Level;Transfers    Examination-Participation Restrictions Community Activity;Shop;Yard Work    Conservation officer, historic buildingstability/Clinical Decision Making Evolving/Moderate complexity    Rehab Potential Good    PT Frequency 2x / week    PT Duration 8 weeks    PT Treatment/Interventions Gait training;Stair training;Therapeutic activities;Therapeutic exercise;Balance training;Neuromuscular re-education;Patient/family education    PT Next Visit Plan Postural stability work with increasing emphasis on proprioceptive demands, gait re-training and strategies for improving toe clearance, hip strengthening    PT Home Exercise Plan Discussed exercises to be performed safely at home (semitandem at counter, sit to stand, dorsiflexion stretching). Formal HEP to be reviewed next  visit.    Consulted and Agree with Plan of  Care Patient             Patient will benefit from skilled therapeutic intervention in order to improve the following deficits and impairments:  Abnormal gait, Decreased endurance, Decreased activity tolerance, Impaired sensation, Difficulty walking, Decreased balance, Cardiopulmonary status limiting activity, Impaired flexibility  Visit Diagnosis: Imbalance  Difficulty in walking, not elsewhere classified  Shortness of breath     Problem List Patient Active Problem List   Diagnosis Date Noted   Back pain 09/08/2018   EDEMA 12/07/2009   METABOLIC SYNDROME X 01/14/2009   HYPERLIPIDEMIA 12/16/2008   ATRIAL FIBRILLATION 12/16/2008   Consuela Mimes, PT, DPT #[id number]  Gertie Exon 04/18/2021, 2:24 PM  [facility] [address] [place], [place], [address] Phone: [phone]   Fax:  [phone]  Name: [name] [name] MRN: [id number] Date of Birth: Apr 23, 1932

## 2021-04-19 ENCOUNTER — Other Ambulatory Visit (HOSPITAL_COMMUNITY): Payer: Medicare Other

## 2021-04-19 ENCOUNTER — Other Ambulatory Visit (HOSPITAL_COMMUNITY): Payer: Self-pay | Admitting: Physician Assistant

## 2021-04-19 ENCOUNTER — Encounter: Payer: Medicare Other | Admitting: Physical Therapy

## 2021-04-19 ENCOUNTER — Encounter (HOSPITAL_COMMUNITY): Payer: Self-pay

## 2021-04-19 DIAGNOSIS — M23203 Derangement of unspecified medial meniscus due to old tear or injury, right knee: Secondary | ICD-10-CM

## 2021-04-24 ENCOUNTER — Encounter: Payer: Medicare Other | Admitting: Physical Therapy

## 2021-04-26 ENCOUNTER — Encounter: Payer: Medicare Other | Admitting: Physical Therapy

## 2021-05-03 ENCOUNTER — Encounter: Payer: Medicare Other | Admitting: Physical Therapy

## 2021-05-08 ENCOUNTER — Encounter: Payer: Medicare Other | Admitting: Physical Therapy

## 2021-05-10 ENCOUNTER — Encounter: Payer: Medicare Other | Admitting: Physical Therapy

## 2021-05-15 ENCOUNTER — Encounter: Payer: Medicare Other | Admitting: Physical Therapy

## 2021-05-17 ENCOUNTER — Encounter: Payer: Medicare Other | Admitting: Physical Therapy

## 2021-05-22 ENCOUNTER — Encounter: Payer: Medicare Other | Admitting: Physical Therapy

## 2021-05-23 ENCOUNTER — Other Ambulatory Visit: Payer: Self-pay

## 2021-05-23 ENCOUNTER — Ambulatory Visit (HOSPITAL_COMMUNITY)
Admission: RE | Admit: 2021-05-23 | Discharge: 2021-05-23 | Disposition: A | Discharge status: Home / Self Care | Payer: Medicare Other | Source: Ambulatory Visit | Attending: Physician Assistant | Admitting: Physician Assistant

## 2021-05-23 DIAGNOSIS — Z950 Presence of cardiac pacemaker: Secondary | ICD-10-CM | POA: Insufficient documentation

## 2021-05-23 DIAGNOSIS — M23203 Derangement of unspecified medial meniscus due to old tear or injury, right knee: Secondary | ICD-10-CM | POA: Diagnosis present

## 2021-05-23 NOTE — Progress Notes (Signed)
Per order,  Changed device settings for MRI to  VOO at 100 bpm  Will program device back to pre-MRI settings after completion of exam, and send transmission   

## 2021-05-24 ENCOUNTER — Encounter: Payer: Medicare Other | Admitting: Physical Therapy

## 2022-03-18 IMAGING — MR MR KNEE*R* W/O CM
4 of 7 series · 18 of 40 positions shown · non-contrast
Comparison: None.

CLINICAL DATA: Patient complains of chronic right knee pain that
has been worse since he fell 6 months ago.

EXAM:
MRI OF THE RIGHT KNEE WITHOUT CONTRAST
TECHNIQUE: Multiplanar, multisequence MR imaging of the knee was performed. No
intravenous contrast was administered.

[Series 3: T2 fat-sat · axial · 4.0mm · 0.31mm/px · z∈[-69,+40]mm · 3 of 28 slices shown]
[im 6/28]
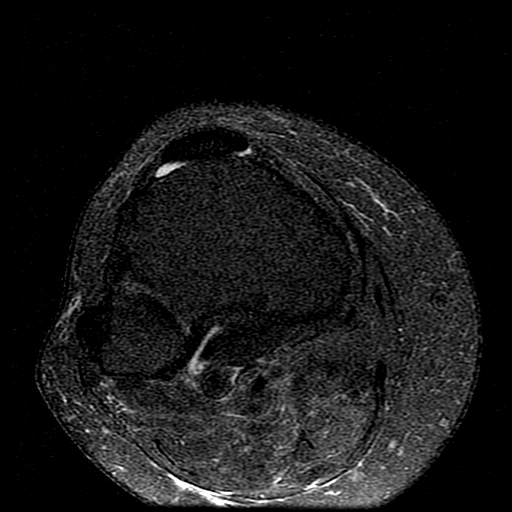
[im 17/28]
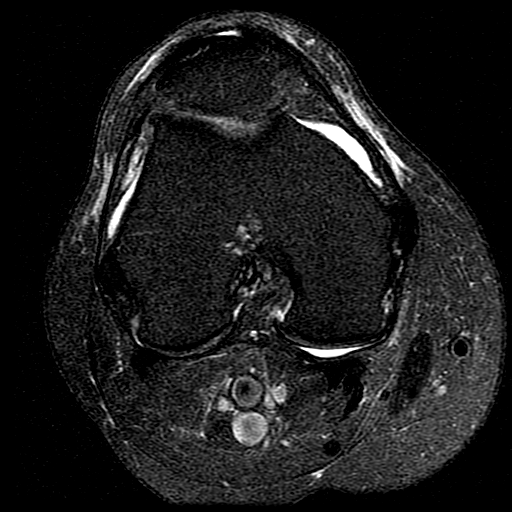
[im 28/28]
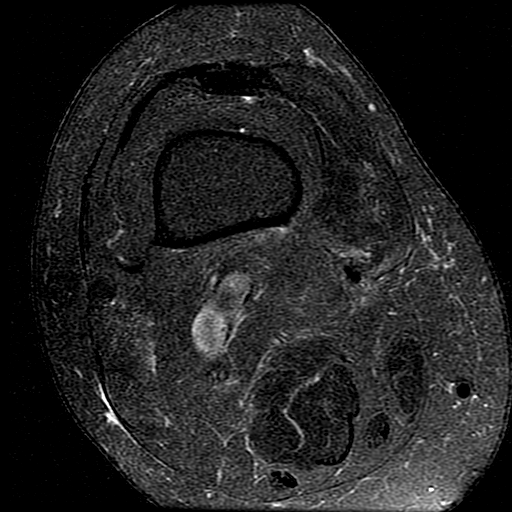

[Series 9: PD fat-sat · sagittal · 3.0mm · 0.35mm/px · 6 of 27 slices shown (1 of 2)]
[im 1/27]
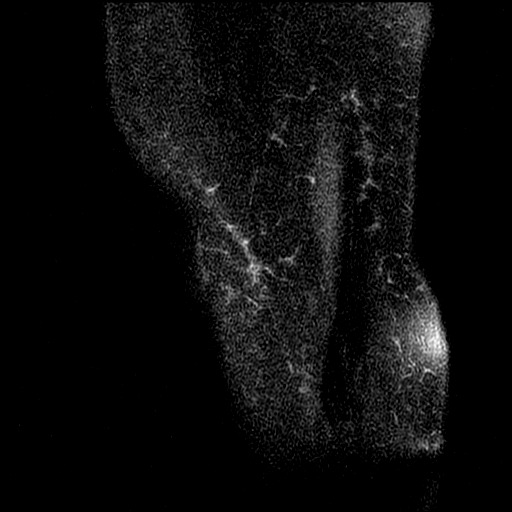
[im 6/27]
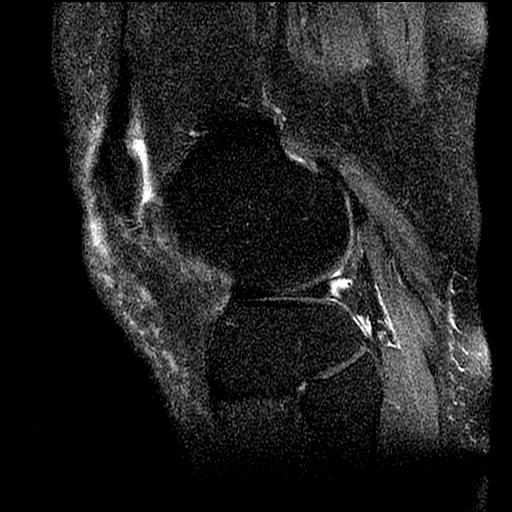
[im 11/27]
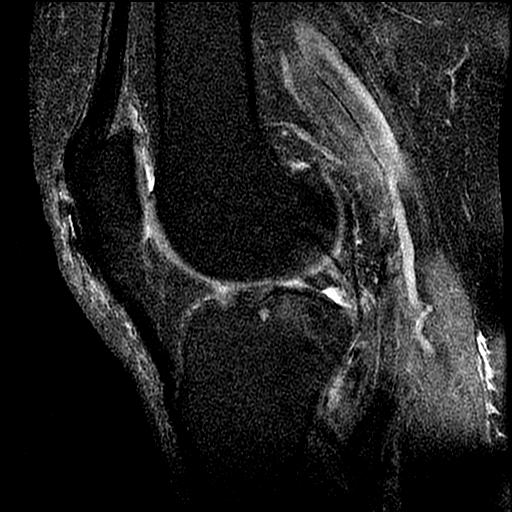
[im 16/27]
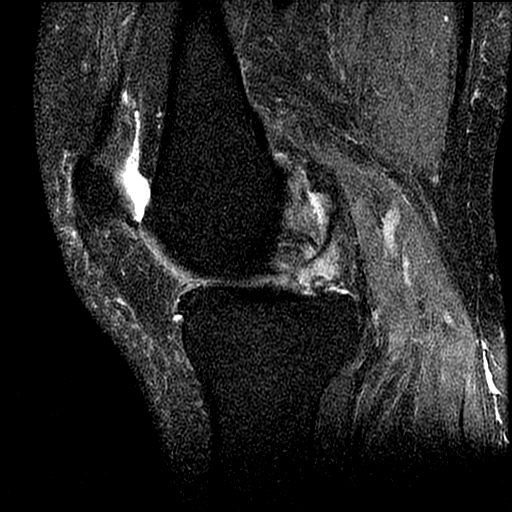
[im 21/27]
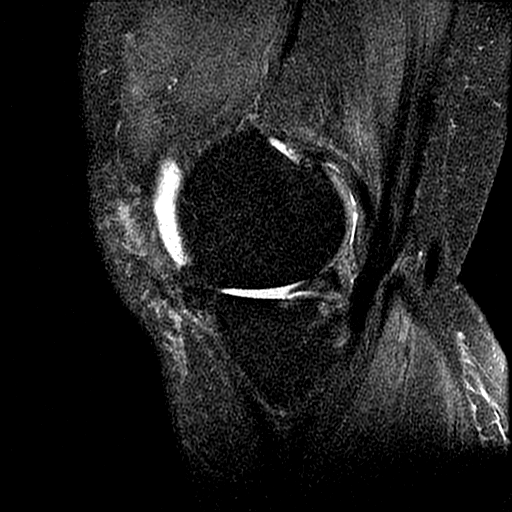
[im 27/27]
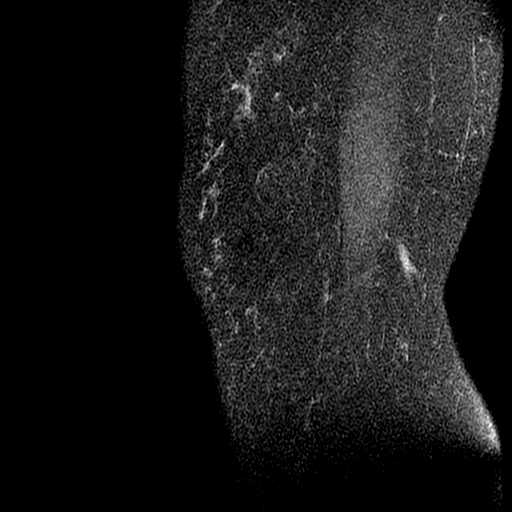

[Series 10: PD fat-sat · coronal · 4.0mm · 0.31mm/px · 6 of 24 slices shown (2 of 2)]
[im 1/24]
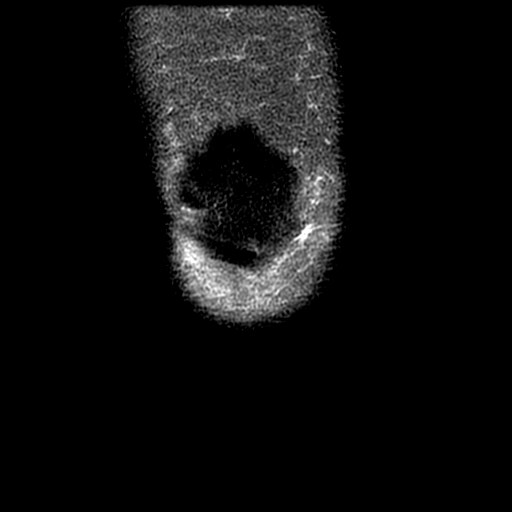
[im 5/24]
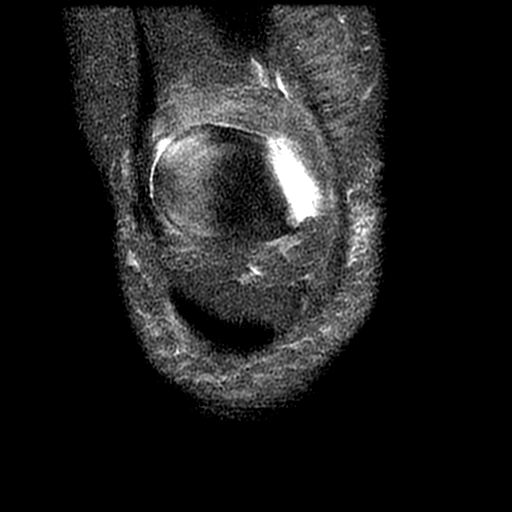
[im 10/24]
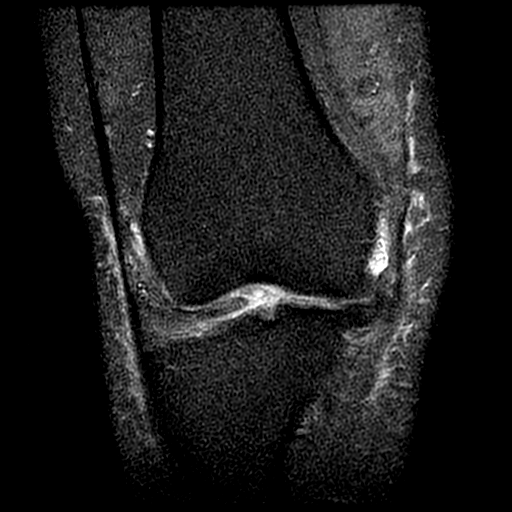
[im 14/24]
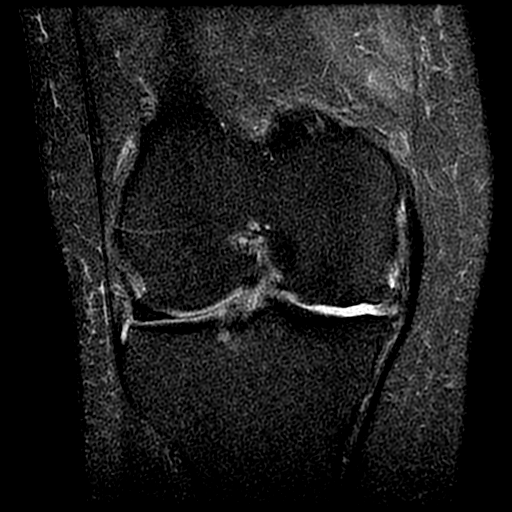
[im 19/24]
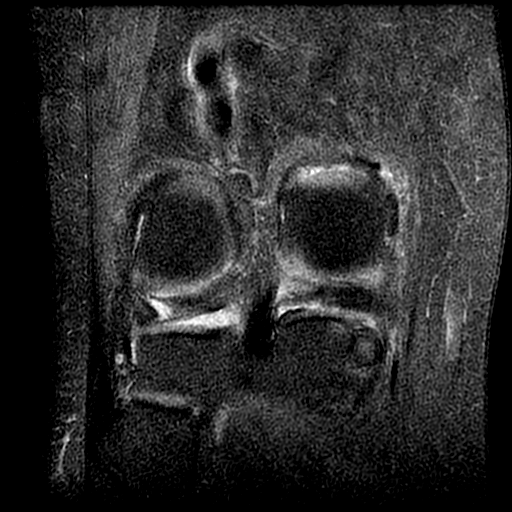
[im 24/24]
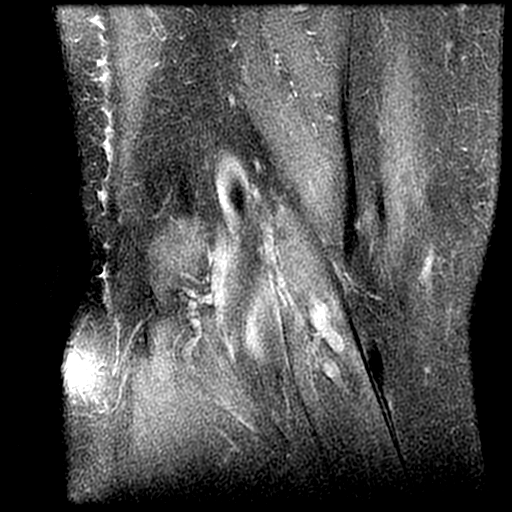

[Series 11: PD · coronal · 2.0mm · 0.31mm/px · 3 of 15 slices shown]
[im 1/15]
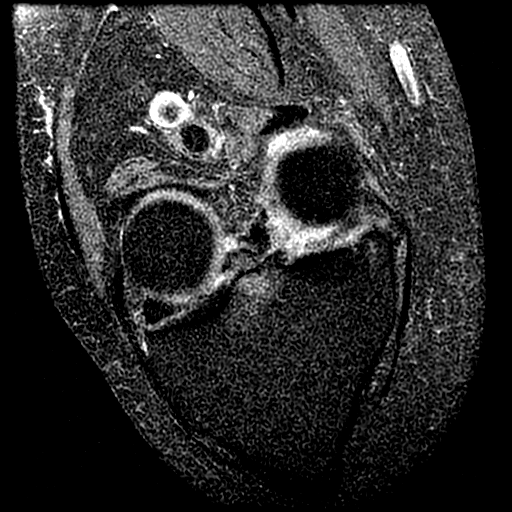
[im 10/15]
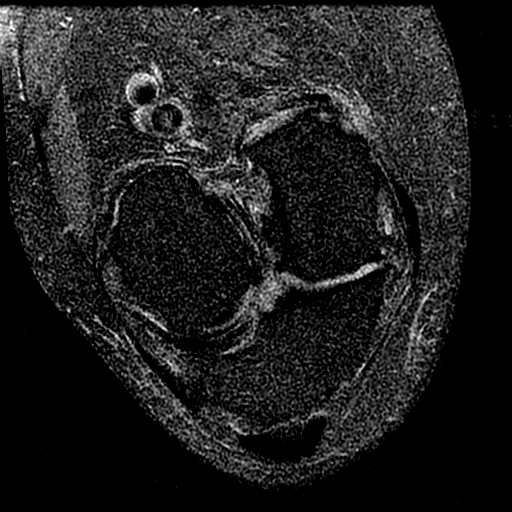
[im 15/15]
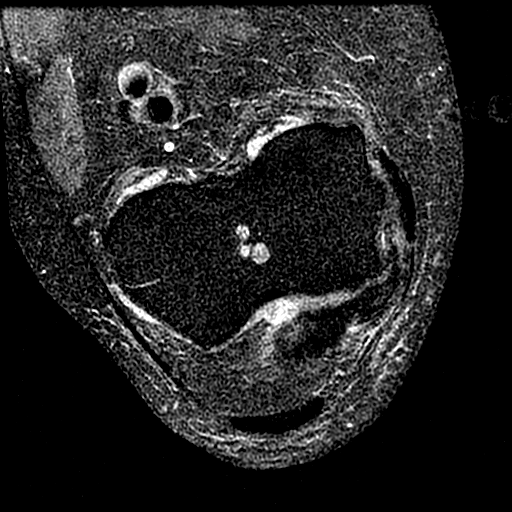

[18 of 40 positions shown; findings below may reference images not displayed]

FINDINGS: MENISCI

Medial meniscus: Radial tear of the posterior horn of the medial
meniscus towards the meniscal root with peripheral meniscal
extrusion. Degeneration of the posterior horn and body of the medial
meniscus. Small radial tear of the free edge of the body of the
medial meniscus.

Lateral meniscus: Degeneration of the anterior horn of the lateral
meniscus. No discrete tear.

LIGAMENTS

Cruciates: Intact ACL and PCL. Increased signal and expansion of the
ACL as can be seen with mucinous degeneration and subcortical bone
marrow edema at the insertion.

Collaterals: Medial collateral ligament is intact. Lateral
collateral ligament complex is intact.

CARTILAGE

Patellofemoral:  No chondral defect.

Medial: Full-thickness cartilage loss of the medial femorotibial
compartment with small marginal osteophytes.

Lateral:  No chondral defect.  Small marginal osteophytes.

Joint:  No joint effusion. Normal Hoffa's fat. No plical thickening.

Popliteal Fossa:  No Baker cyst. Intact popliteus tendon.

Extensor Mechanism: Intact quadriceps tendon. Intact patellar
tendon. Intact medial patellar retinaculum. Intact lateral patellar
retinaculum. Intact MPFL.

Bones:  No acute osseous abnormality.  No aggressive osseous lesion.

Other: Muscles are normal. Small partial tear of the origin of the
medial and lateral gastrocnemius tendons.
IMPRESSION: 1. Radial tear of the posterior horn of the medial meniscus towards
the meniscal root with peripheral meniscal extrusion. Degeneration
of the posterior horn and body of the medial meniscus. Small radial
tear of the free edge of the body of the medial meniscus.
2.  Tricompartmental cartilage abnormalities as described above.
3. Intact ACL with mucinous degeneration.

## 2022-04-23 ENCOUNTER — Ambulatory Visit
Admission: EM | Admit: 2022-04-23 | Discharge: 2022-04-23 | Disposition: A | Discharge status: Home / Self Care | Payer: Medicare Other

## 2022-04-23 DIAGNOSIS — R1031 Right lower quadrant pain: Secondary | ICD-10-CM

## 2022-04-23 DIAGNOSIS — G8929 Other chronic pain: Secondary | ICD-10-CM | POA: Diagnosis not present

## 2022-04-23 NOTE — ED Triage Notes (Signed)
Pt c/o abdominal pain x5days.   Pt states his last bowel movement was yesterday evening and that it was solid.   Pt denies any change of diet or exercise.   Pt states that he has a history of GI Bleeds and declines history of Diverticulitis or hemorrhoids.   Pt states this is the first time he has had this abdominal pain.

## 2022-04-23 NOTE — ED Provider Notes (Signed)
[facility]    CSN: [id number] Arrival date & time: 04/23/22  1245      History   Chief Complaint Chief Complaint  Patient presents with   Abdominal Pain    HPI [name] [name] is a 87 y.o. male who presents with abdominal pain x 5 days. Denies fever. Has had decreased appetite. Has had BM's qd, but 2 days ago had trouble passing it, and felt that it helped the pain a little. But the pain has continued. Denies hx of diverticulitis.     Past Medical History:  Diagnosis Date   Atrial fibrillation (HCC)    Atrial fibrillation (HCC)    GIB (gastrointestinal bleeding)    With colonic AVMs S/P coiling   Hyperlipidemia    Hypertension    Metabolic syndrome    Paroxysmal supraventricular tachycardia (HCC)    PMR (polymyalgia rheumatica) (HCC)    Presence of permanent cardiac pacemaker    Sick sinus syndrome with tachycardia (HCC)    Sleep apnea    Tachycardia     Patient Active Problem List   Diagnosis Date Noted   Back pain 09/08/2018   EDEMA 12/07/2009   METABOLIC SYNDROME X 01/14/2009   HYPERLIPIDEMIA 12/16/2008   ATRIAL FIBRILLATION 12/16/2008    Past Surgical History:  Procedure Laterality Date   CARPAL TUNNEL RELEASE  1982   Right   CARPAL TUNNEL RELEASE  2000   Left   COLONOSCOPY WITH PROPOFOL N/A 06/18/2016   Procedure: COLONOSCOPY WITH PROPOFOL;  Surgeon: Scot Jun, MD;  Location: [facility] ENDOSCOPY;  Service: Endoscopy;  Laterality: N/A;   FECAL TRANSPLANT N/A 06/18/2016   Procedure: FECAL TRANSPLANT;  Surgeon: Scot Jun, MD;  Location: [facility] ENDOSCOPY;  Service: Endoscopy;  Laterality: N/A;   KNEE SURGERY  1997   Left       Home Medications    Prior to Admission medications   Medication Sig Start Date End Date Taking? Authorizing Provider  acetaminophen (TYLENOL) 325 MG tablet Take 2 tablets (650 mg total) by mouth every 6 (six) hours as needed for mild pain or headache (or Fever >/= 101). 09/09/18  Yes Gouru, Deanna Artis,  MD  aspirin EC 81 MG tablet Take 81 mg by mouth daily.   Yes [provider]  ferrous sulfate 325 (65 FE) MG tablet Take 325 mg by mouth daily.   Yes [provider]  fish oil-omega-3 fatty acids 1000 MG capsule Take 1 g by mouth 2 (two) times daily.   Yes [provider]  furosemide (LASIX) 40 MG tablet Take 80 mg by mouth daily.    Yes [provider]  ibuprofen (ADVIL,MOTRIN) 400 MG tablet Take 1 tablet (400 mg total) by mouth every 6 (six) hours as needed for moderate pain. 09/09/18  Yes Gouru, Deanna Artis, MD  Lutein 6 MG CAPS Take 1 capsule by mouth daily.   Yes [provider]  metFORMIN (GLUCOPHAGE-XR) 500 MG 24 hr tablet Take 500 mg by mouth 2 (two) times daily.   Yes [provider]  metoprolol tartrate (LOPRESSOR) 25 MG tablet Take 37.5 mg by mouth 2 (two) times daily.   Yes [provider]  Multiple Vitamins-Minerals (CENTRUM SILVER PO) Take by mouth daily.   Yes [provider]  Multiple Vitamins-Minerals (ICAPS AREDS 2 PO) Take 1 capsule by mouth daily.   Yes [provider]  polyethylene glycol (MIRALAX / GLYCOLAX) packet Take 17 g by mouth daily as needed for moderate constipation. 09/09/18  Yes Gouru,  Deanna Artis, MD  rosuvastatin (CRESTOR) 5 MG tablet Take 2.5 mg by mouth every other day.    Yes [provider]  Tiotropium Bromide Monohydrate (SPIRIVA RESPIMAT) 2.5 MCG/ACT AERS Inhale 2 puffs into the lungs daily.    Yes [provider]    Family History Family History  Problem Relation Age of Onset   Cancer Other    Coronary artery disease Other    Heart disease Mother    Cancer Father        lymphatic    Social History Social History   Tobacco Use   Smoking status: Former    Types: Cigarettes    Quit date: 08/27/1974    Years since quitting: 47.6   Smokeless tobacco: Never  Vaping Use   Vaping Use: Never used  Substance Use Topics   Alcohol use: No   Drug use: No      Allergies   Simvastatin, Atorvastatin, and Warfarin   Review of Systems Review of Systems  Constitutional:  Positive for appetite change. Negative for activity change, chills, diaphoresis and fever.  Respiratory:  Negative for cough.   Gastrointestinal:  Positive for abdominal pain. Negative for constipation, diarrhea, nausea and vomiting.     Physical Exam Triage Vital Signs ED Triage Vitals  Enc Vitals Group     BP 04/23/22 1334 110/72     Pulse Rate 04/23/22 1334 91     Resp 04/23/22 1334 18     Temp 04/23/22 1334 98.1 F (36.7 C)     Temp Source 04/23/22 1334 Oral     SpO2 04/23/22 1334 98 %     Weight 04/23/22 1332 237 lb (107.5 kg)     Height 04/23/22 1332 6\' 2"  (1.88 m)     Head Circumference --      Peak Flow --      Pain Score 04/23/22 1330 6     Pain Loc --      Pain Edu? --      Excl. in GC? --    No data found.  Updated Vital Signs BP 110/72 (BP Location: Left Arm)   Pulse 91   Temp 98.1 F (36.7 C) (Oral)   Resp 18   Ht 6\' 2"  (1.88 m)   Wt 237 lb (107.5 kg)   SpO2 98%   BMI 30.43 kg/m   Visual Acuity Right Eye Distance:   Left Eye Distance:   Bilateral Distance:    Right Eye Near:   Left Eye Near:    Bilateral Near:     Physical Exam Vitals and nursing note reviewed.  Constitutional:      General: He is not in acute distress.    Appearance: He is not toxic-appearing.  HENT:     Right Ear: External ear normal.     Left Ear: External ear normal.  Eyes:     General: No scleral icterus.    Conjunctiva/sclera: Conjunctivae normal.  Pulmonary:     Effort: Pulmonary effort is normal.  Abdominal:     General: Bowel sounds are normal.     Tenderness: There is abdominal tenderness in the right upper quadrant and suprapubic area. There is guarding and rebound.  Musculoskeletal:     Cervical back: Neck supple.     Comments: Uses a cane and is wearing a R knee brace  Skin:    General: Skin is warm and dry.     Findings: No rash.   Neurological:     Mental Status:  He is alert and oriented to person, place, and time.     Gait: Gait normal.  Psychiatric:        Mood and Affect: Mood normal.        Behavior: Behavior normal.        Thought Content: Thought content normal.        Judgment: Judgment normal.      UC Treatments / Results  Labs (all labs ordered are listed, but only abnormal results are displayed) Labs Reviewed - No data to display  EKG   Radiology No results found.  Procedures Procedures (including critical care time)  Medications Ordered in UC Medications - No data to display  Initial Impression / Assessment and Plan / UC Course  I have reviewed the triage vital signs and the nursing notes.  Acute R abdominal pain  We dont have CT today, so I sent him to ER for further work up.  Final Clinical Impressions(s) / UC Diagnoses   Final diagnoses:  Abdominal pain, chronic, right lower quadrant     Discharge Instructions      Your right abdomen is very tender and I believe you need to have a cat scan since you are so guarded with my exam. Please go to the ER today.      ED Prescriptions   None    PDMP not reviewed this encounter.   Garey Ham, [place] 04/23/22 1354

## 2022-04-23 NOTE — Discharge Instructions (Signed)
Your right abdomen is very tender and I believe you need to have a cat scan since you are so guarded with my exam. Please go to the ER today.

## 2022-05-28 ENCOUNTER — Other Ambulatory Visit: Payer: Self-pay | Admitting: Pediatrics

## 2022-05-28 DIAGNOSIS — K6389 Other specified diseases of intestine: Secondary | ICD-10-CM

## 2022-05-28 DIAGNOSIS — R1900 Intra-abdominal and pelvic swelling, mass and lump, unspecified site: Secondary | ICD-10-CM

## 2022-07-11 ENCOUNTER — Ambulatory Visit (HOSPITAL_COMMUNITY)
Admission: RE | Admit: 2022-07-11 | Discharge: 2022-07-11 | Disposition: A | Discharge status: Home / Self Care | Payer: Medicare Other | Source: Ambulatory Visit | Attending: Pediatrics | Admitting: Pediatrics

## 2022-07-11 DIAGNOSIS — K6389 Other specified diseases of intestine: Secondary | ICD-10-CM | POA: Diagnosis not present

## 2022-07-11 DIAGNOSIS — R1900 Intra-abdominal and pelvic swelling, mass and lump, unspecified site: Secondary | ICD-10-CM | POA: Diagnosis present

## 2022-07-11 MED ORDER — GADOBUTROL 1 MMOL/ML IV SOLN
10.0000 mL | Freq: Once | INTRAVENOUS | Status: AC | PRN
  Administered 2022-07-11: 10 mL via INTRAVENOUS

## 2022-09-13 ENCOUNTER — Other Ambulatory Visit: Payer: Self-pay | Admitting: Family Medicine

## 2022-09-13 DIAGNOSIS — Z9189 Other specified personal risk factors, not elsewhere classified: Secondary | ICD-10-CM

## 2022-09-13 DIAGNOSIS — E213 Hyperparathyroidism, unspecified: Secondary | ICD-10-CM

## 2022-09-19 ENCOUNTER — Ambulatory Visit
Admission: RE | Admit: 2022-09-19 | Discharge: 2022-09-19 | Disposition: A | Discharge status: Home / Self Care | Payer: Medicare Other | Source: Ambulatory Visit | Attending: Family Medicine | Admitting: Family Medicine

## 2022-09-19 DIAGNOSIS — Z9189 Other specified personal risk factors, not elsewhere classified: Secondary | ICD-10-CM | POA: Insufficient documentation

## 2022-09-19 DIAGNOSIS — E213 Hyperparathyroidism, unspecified: Secondary | ICD-10-CM | POA: Insufficient documentation

## 2024-03-24 ENCOUNTER — Encounter: Payer: Self-pay | Admitting: Ophthalmology

## 2024-03-25 ENCOUNTER — Encounter: Payer: Self-pay | Admitting: Ophthalmology

## 2024-03-25 NOTE — Anesthesia Preprocedure Evaluation (Addendum)
 Anesthesia Evaluation  Patient identified by MRN, date of birth, ID band Patient awake    Reviewed: Allergy & Precautions, H&P , NPO status , Patient's Chart, lab work & pertinent test results  Airway Mallampati: III   Neck ROM: Full    Dental no notable dental hx. (+) Chipped Deep chip right upper central incisor; patient states, it is dead, had a root canal and the tooth is dead.  :   Pulmonary sleep apnea , former smoker   Pulmonary exam normal breath sounds clear to auscultation       Cardiovascular hypertension, Normal cardiovascular exam+ pacemaker  Rhythm:Regular Rate:Normal  Not in atrial fib at time of exam, heart rate steady.    Neuro/Psych negative neurological ROS  negative psych ROS   GI/Hepatic negative GI ROS, Neg liver ROS,,,  Endo/Other  negative endocrine ROS    Renal/GU negative Renal ROS  negative genitourinary   Musculoskeletal negative musculoskeletal ROS (+)    Abdominal   Peds negative pediatric ROS (+)  Hematology negative hematology ROS (+) Blood dyscrasia, anemia   Anesthesia Other Findings Paroxysmal supraventricular tachycardia Sick sinus syndrome with tachycardia  Metabolic syndrome Hyperlipidemia  Atrial fibrillation  GIB (gastrointestinal bleeding)  PMR (polymyalgia rheumatica)  Hypertension  Atrial fibrillation (HCC) Presence of permanent cardiac pacemaker  Sleep apnea Pre-diabetes  Anemia    Reproductive/Obstetrics negative OB ROS                              Anesthesia Physical Anesthesia Plan  ASA: 3  Anesthesia Plan: MAC   Post-op Pain Management:    Induction: Intravenous  PONV Risk Score and Plan:   Airway Management Planned: Natural Airway and Nasal Cannula  Additional Equipment:   Intra-op Plan:   Post-operative Plan:   Informed Consent: I have reviewed the patients History and Physical, chart, labs and discussed the  procedure including the risks, benefits and alternatives for the proposed anesthesia with the patient or authorized representative who has indicated his/her understanding and acceptance.     Dental Advisory Given  Plan Discussed with: Anesthesiologist, CRNA and Surgeon  Anesthesia Plan Comments: (Patient consented for risks of anesthesia including but not limited to:  - adverse reactions to medications - damage to eyes, teeth, lips or other oral mucosa - nerve damage due to positioning  - sore throat or hoarseness - Damage to heart, brain, nerves, lungs, other parts of body or loss of life  Patient voiced understanding and assent.)         Anesthesia Quick Evaluation

## 2024-04-01 NOTE — Discharge Instructions (Signed)

## 2024-04-06 ENCOUNTER — Encounter
Admission: RE | Disposition: A | Discharge status: Home / Self Care | Payer: Self-pay | Source: Home / Self Care | Attending: Ophthalmology

## 2024-04-06 ENCOUNTER — Encounter: Payer: Self-pay | Admitting: Ophthalmology

## 2024-04-06 ENCOUNTER — Ambulatory Visit
Admission: RE | Admit: 2024-04-06 | Discharge: 2024-04-06 | Disposition: A | Discharge status: Home / Self Care | Attending: Ophthalmology | Admitting: Ophthalmology

## 2024-04-06 ENCOUNTER — Other Ambulatory Visit: Payer: Self-pay

## 2024-04-06 ENCOUNTER — Ambulatory Visit: Payer: Self-pay | Admitting: Anesthesiology

## 2024-04-06 DIAGNOSIS — H2512 Age-related nuclear cataract, left eye: Secondary | ICD-10-CM | POA: Diagnosis present

## 2024-04-06 DIAGNOSIS — G4730 Sleep apnea, unspecified: Secondary | ICD-10-CM | POA: Diagnosis not present

## 2024-04-06 DIAGNOSIS — D649 Anemia, unspecified: Secondary | ICD-10-CM | POA: Insufficient documentation

## 2024-04-06 DIAGNOSIS — Z950 Presence of cardiac pacemaker: Secondary | ICD-10-CM | POA: Diagnosis not present

## 2024-04-06 DIAGNOSIS — I10 Essential (primary) hypertension: Secondary | ICD-10-CM | POA: Insufficient documentation

## 2024-04-06 DIAGNOSIS — Z87891 Personal history of nicotine dependence: Secondary | ICD-10-CM | POA: Diagnosis not present

## 2024-04-06 DIAGNOSIS — D759 Disease of blood and blood-forming organs, unspecified: Secondary | ICD-10-CM | POA: Diagnosis not present

## 2024-04-06 HISTORY — DX: Presence of cardiac pacemaker: Z95.0

## 2024-04-06 HISTORY — DX: Anemia, unspecified: D64.9

## 2024-04-06 HISTORY — DX: Prediabetes: R73.03

## 2024-04-06 HISTORY — PX: CATARACT EXTRACTION W/PHACO: SHX586

## 2024-04-06 SURGERY — PHACOEMULSIFICATION, CATARACT, WITH IOL INSERTION
Anesthesia: Monitor Anesthesia Care | Site: Eye | Laterality: Left

## 2024-04-06 MED ORDER — ARMC OPHTHALMIC DILATING DROPS
OPHTHALMIC | Status: AC
  Filled 2024-04-06: qty 0.5

## 2024-04-06 MED ORDER — LIDOCAINE HCL (PF) 2 % IJ SOLN
INTRAOCULAR | Status: DC | PRN
  Administered 2024-04-06 (×2): 4 mL via INTRAOCULAR

## 2024-04-06 MED ORDER — TETRACAINE HCL 0.5 % OP SOLN
1.0000 [drp] | OPHTHALMIC | Status: DC | PRN
  Administered 2024-04-06 (×6): 1 [drp] via OPHTHALMIC

## 2024-04-06 MED ORDER — SIGHTPATH DOSE#1 BSS IO SOLN
INTRAOCULAR | Status: DC | PRN
Start: 2024-04-06 — End: 2024-04-06
  Administered 2024-04-06 (×2): 15 mL via INTRAOCULAR

## 2024-04-06 MED ORDER — FENTANYL CITRATE (PF) 100 MCG/2ML IJ SOLN
INTRAMUSCULAR | Status: AC
  Filled 2024-04-06: qty 2

## 2024-04-06 MED ORDER — SIGHTPATH DOSE#1 NA HYALUR & NA CHOND-NA HYALUR IO KIT
PACK | INTRAOCULAR | Status: DC | PRN
Start: 2024-04-06 — End: 2024-04-06
  Administered 2024-04-06 (×2): 1 via OPHTHALMIC

## 2024-04-06 MED ORDER — MIDAZOLAM HCL 2 MG/2ML IJ SOLN
INTRAMUSCULAR | Status: DC | PRN
Start: 2024-04-06 — End: 2024-04-06
  Administered 2024-04-06 (×2): .5 mg via INTRAVENOUS

## 2024-04-06 MED ORDER — ARMC OPHTHALMIC DILATING DROPS
1.0000 | OPHTHALMIC | Status: DC | PRN
  Administered 2024-04-06 (×6): 1 via OPHTHALMIC

## 2024-04-06 MED ORDER — MOXIFLOXACIN HCL 0.5 % OP SOLN
OPHTHALMIC | Status: DC | PRN
  Administered 2024-04-06 (×2): .2 mL via OPHTHALMIC

## 2024-04-06 MED ORDER — MIDAZOLAM HCL 2 MG/2ML IJ SOLN
INTRAMUSCULAR | Status: AC
  Filled 2024-04-06: qty 2

## 2024-04-06 MED ORDER — TETRACAINE HCL 0.5 % OP SOLN
OPHTHALMIC | Status: AC
Start: 2024-04-06 — End: 2024-04-06
  Filled 2024-04-06: qty 4

## 2024-04-06 MED ORDER — SIGHTPATH DOSE#1 BSS IO SOLN: INTRAOCULAR | Status: DC | PRN

## 2024-04-06 MED ORDER — LACTATED RINGERS IV SOLN: INTRAVENOUS | Status: DC

## 2024-04-06 MED ORDER — FENTANYL CITRATE (PF) 100 MCG/2ML IJ SOLN
INTRAMUSCULAR | Status: DC | PRN
  Administered 2024-04-06 (×2): 50 ug via INTRAVENOUS

## 2024-04-06 SURGICAL SUPPLY — 9 items
DISSECTOR HYDRO NUCLEUS 50X22 (MISCELLANEOUS) ×1 IMPLANT
FEE CATARACT SUITE SIGHTPATH (MISCELLANEOUS) ×1 IMPLANT
GLOVE PI ULTRA LF STRL 7.5 (GLOVE) ×1 IMPLANT
GLOVE SURG SYN 6.5 PF PI BL (GLOVE) ×1 IMPLANT
GLOVE SURG SYN 8.5 PF PI BL (GLOVE) ×1 IMPLANT
LENS IOL TECNIS EYHANCE 20.5 (Intraocular Lens) IMPLANT
NDL FILTER BLUNT 18X1 1/2 (NEEDLE) ×1 IMPLANT
NEEDLE FILTER BLUNT 18X1 1/2 (NEEDLE) ×1 IMPLANT
SYR 3ML LL SCALE MARK (SYRINGE) ×1 IMPLANT

## 2024-04-06 NOTE — Transfer of Care (Signed)
 Immediate Anesthesia Transfer of Care Note  Patient: [name] [name]  Procedure(s) Performed: PHACOEMULSIFICATION, CATARACT, WITH IOL INSERTION 6.43 00:43.4 (Left: Eye)  Patient Location: PACU  Anesthesia Type: MAC  Level of Consciousness: awake, alert  and patient cooperative  Airway and Oxygen Therapy: Patient Spontanous Breathing and Patient connected to supplemental oxygen  Post-op Assessment: Post-op Vital signs reviewed, Patient's Cardiovascular Status Stable, Respiratory Function Stable, Patent Airway and No signs of Nausea or vomiting  Post-op Vital Signs: Reviewed and stable  Complications: No notable events documented.

## 2024-04-06 NOTE — Anesthesia Postprocedure Evaluation (Signed)
 Anesthesia Post Note  Patient: [name] [name]  Procedure(s) Performed: PHACOEMULSIFICATION, CATARACT, WITH IOL INSERTION 6.43 00:43.4 (Left: Eye)  Patient location during evaluation: PACU Anesthesia Type: MAC Level of consciousness: awake and alert Pain management: pain level controlled Vital Signs Assessment: post-procedure vital signs reviewed and stable Respiratory status: spontaneous breathing, nonlabored ventilation, respiratory function stable and patient connected to nasal cannula oxygen Cardiovascular status: stable and blood pressure returned to baseline Postop Assessment: no apparent nausea or vomiting Anesthetic complications: no   No notable events documented.   Last Vitals:  Vitals:   04/06/24 0908 04/06/24 0912  BP:  103/65  Pulse: 73 73  Resp: 10 15  Temp: 36.6 C   SpO2: 96% 95%    Last Pain:  Vitals:   04/06/24 0912  TempSrc:   PainSc: 0-No pain                 Garnette Greb C Iylah Dworkin

## 2024-04-06 NOTE — H&P (Signed)
 [facility]   Primary Care Physician:  Eliverto Bette Hover, MD Ophthalmologist: Dr. Adine Novak  Pre-Procedure History & Physical: HPI:  [name] [name] is a 89 y.o. male here for cataract surgery.   Past Medical History:  Diagnosis Date   Anemia    Atrial fibrillation (HCC)    Atrial fibrillation (HCC)    Biventricular cardiac pacemaker in situ    GIB (gastrointestinal bleeding)    With colonic AVMs S/P coiling   Hyperlipidemia    Hypertension    Metabolic syndrome    Paroxysmal supraventricular tachycardia (HCC)    PMR (polymyalgia rheumatica) (HCC)    Pre-diabetes    Presence of permanent cardiac pacemaker    Sick sinus syndrome with tachycardia (HCC)    Sleep apnea    Tachycardia     Past Surgical History:  Procedure Laterality Date   CARPAL TUNNEL RELEASE  1982   Right   CARPAL TUNNEL RELEASE  2000   Left   COLONOSCOPY WITH PROPOFOL  N/A 06/18/2016   Procedure: COLONOSCOPY WITH PROPOFOL ;  Surgeon: Lamar ONEIDA Holmes, MD;  Location: [facility] ENDOSCOPY;  Service: Endoscopy;  Laterality: N/A;   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 Authorizing Provider  acetaminophen  (TYLENOL ) 325 MG tablet Take 2 tablets (650 mg total) by mouth every 6 (six) hours as needed for mild pain or headache (or Fever >/= 101). 09/09/18  Yes Gouru, Aruna, MD  aspirin EC 81 MG tablet Take 81 mg by mouth daily.   Yes [provider]  ferrous sulfate 325 (65 FE) MG tablet Take 325 mg by mouth daily.   Yes [provider]  fish oil-omega-3 fatty acids 1000 MG capsule Take 1 g by mouth 2 (two) times daily.   Yes [provider]  furosemide  (LASIX ) 40 MG tablet Take 80 mg by mouth daily.    Yes [provider]  ibuprofen   (ADVIL ,MOTRIN ) 400 MG tablet Take 1 tablet (400 mg total) by mouth every 6 (six) hours as needed for moderate pain. 09/09/18  Yes Gouru, Aruna, MD  Lutein  6 MG CAPS Take 1 capsule by mouth daily.   Yes [provider]  metoprolol  tartrate (LOPRESSOR ) 25 MG tablet Take 37.5 mg by mouth 2 (two) times daily.   Yes [provider]  Multiple Vitamins-Minerals (CENTRUM SILVER PO) Take by mouth daily.   Yes [provider]  Multiple Vitamins-Minerals (ICAPS AREDS 2 PO) Take 1 capsule by mouth daily.   Yes [provider]  polyethylene glycol (MIRALAX  / GLYCOLAX ) packet Take 17 g by mouth daily as needed for moderate constipation. 09/09/18  Yes Gouru, Aruna, MD  rosuvastatin  (CRESTOR ) 5 MG tablet Take 2.5 mg by mouth every other day.    Yes [provider]  metFORMIN (GLUCOPHAGE-XR) 500 MG 24 hr tablet Take 500 mg by mouth 2 (two) times daily. Patient not taking: Reported on 03/24/2024    [provider]  Tiotropium Bromide  Monohydrate (SPIRIVA  RESPIMAT) 2.5 MCG/ACT AERS Inhale 2 puffs into the lungs daily.  Patient not taking: Reported on 03/24/2024    [provider]    Allergies as of 03/24/2024 - Review Complete 03/24/2024  Allergen Reaction Noted   Simvastatin   06/16/2013   Atorvastatin  06/16/2014  Warfarin  03/31/2012    Family History  Problem Relation Age of Onset   Cancer Other    Coronary artery disease Other    Heart disease Mother    Cancer Father        lymphatic    Social History   Socioeconomic History   Marital status: Married    Spouse name: Not on file   Number of children: Not on file   Years of education: Not on file   Highest education level: Not on file  Occupational History   Occupation: Retired    Associate Professor: RETIRED  Tobacco Use   Smoking status: Former    Current packs/day: 0.00    Types: Cigarettes    Quit date: 08/27/1974    Years since quitting: 49.6   Smokeless tobacco: Never  Vaping Use    Vaping status: Never Used  Substance and Sexual Activity   Alcohol use: No   Drug use: No   Sexual activity: Not Currently    Birth control/protection: None  Other Topics Concern   Not on file  Social History Narrative   Married   Gets regular exercise   Social Drivers of Health   Financial Resource Strain: Low Risk  (01/15/2024)   Received from [facility]   Overall Financial Resource Strain (CARDIA)    Difficulty of Paying Living Expenses: Not very hard  Food Insecurity: Food Insecurity Present (01/15/2024)   Received from [facility]   Hunger Vital Sign    Within the past 12 months, you worried that your food would run out before you got the money to buy more.: Often true    Within the past 12 months, the food you bought just didn't last and you didn't have money to get more.: Often true  Transportation Needs: No Transportation Needs (01/15/2024)   Received from [facility] - Transportation    In the past 12 months, has lack of transportation kept you from medical appointments or from getting medications?: No    Lack of Transportation (Non-Medical): No  Physical Activity: Not on file  Stress: Not on file  Social Connections: Not on file  Intimate Partner Violence: Not on file    Review of Systems: See HPI, otherwise negative ROS  Physical Exam: BP 139/78   Pulse (!) 106   Temp (!) 97.3 F (36.3 C) (Temporal)   Resp 12   Ht 6' 2 (1.88 m)   Wt 106.3 kg   SpO2 95%   BMI 30.08 kg/m  General:   Alert, cooperative. Head:  Normocephalic and atraumatic. Respiratory:  Normal work of breathing. Cardiovascular:  NAD  Impression/Plan: [name] [name] is here for cataract surgery.  Risks, benefits, limitations, and alternatives regarding cataract surgery have been reviewed with the patient.  Questions have been answered.  All parties agreeable.   Adine Novak, MD  04/06/2024, 8:42 AM

## 2024-04-06 NOTE — Op Note (Signed)
 OPERATIVE NOTE  Jacarius Handel [id number] 04/06/2024   PREOPERATIVE DIAGNOSIS:  Nuclear sclerotic cataract left eye.  H25.12   POSTOPERATIVE DIAGNOSIS:    Nuclear sclerotic cataract left eye.     PROCEDURE:  Phacoemusification with posterior chamber intraocular lens placement of the left eye   LENS:   Implant Name Type Inv. Item Serial No. Manufacturer Lot No. LRB No. Used Action  LENS IOL TECNIS EYHANCE 20.5 - [id number] Intraocular Lens LENS IOL TECNIS EYHANCE 20.5 [id number] [employer]IGHT[employer]  Left 1 Implanted      Procedure(s): PHACOEMULSIFICATION, CATARACT, WITH IOL INSERTION 6.43 00:43.4 (Left)  SURGEON:  Adine Novak, MD, MPH   ANESTHESIA:  Topical with tetracaine  drops augmented with 1% preservative-free intracameral lidocaine .  ESTIMATED BLOOD LOSS: <1 mL   COMPLICATIONS:  None.   DESCRIPTION OF PROCEDURE:  The patient was identified in the holding room and transported to the operating room and placed in the supine position under the operating microscope.  The left eye was identified as the operative eye and it was prepped and draped in the usual sterile ophthalmic fashion.   A 1.0 millimeter clear-corneal paracentesis was made at the 5:00 position. 0.5 ml of preservative-free 1% lidocaine  with epinephrine was injected into the anterior chamber.  The anterior chamber was filled with viscoelastic.  A 2.4 millimeter keratome was used to make a near-clear corneal incision at the 2:00 position.  A curvilinear capsulorrhexis was made with a cystotome and capsulorrhexis forceps.  Balanced salt solution was used to hydrodissect and hydrodelineate the nucleus.   Phacoemulsification was then used in stop and chop fashion to remove the lens nucleus and epinucleus.  The remaining cortex was then removed using the irrigation and aspiration handpiece. Viscoelastic was then placed into the capsular bag to distend it for lens placement.  A lens was then injected into the capsular bag.  The  remaining viscoelastic was aspirated.   Wounds were hydrated with balanced salt solution.  The anterior chamber was inflated to a physiologic pressure with balanced salt solution.  Intracameral vigamox  0.1 mL undiltued was injected into the eye and a drop placed onto the ocular surface.  No wound leaks were noted.  The patient was taken to the recovery room in stable condition without complications of anesthesia or surgery  Adine Novak 04/06/2024, 9:06 AM

## 2024-04-08 NOTE — Anesthesia Preprocedure Evaluation (Addendum)
 Anesthesia Evaluation  Patient identified by MRN, date of birth, ID band Patient awake    Reviewed: Allergy & Precautions, H&P , NPO status , Patient's Chart, lab work & pertinent test results  Airway Mallampati: III   Neck ROM: Full    Dental no notable dental hx. (+) Chipped Deep chip right upper central incisor; patient states, it is dead, had a root canal and the tooth is dead.  : :   Pulmonary sleep apnea , former smoker   Pulmonary exam normal breath sounds clear to auscultation       Cardiovascular hypertension, Normal cardiovascular exam+ pacemaker  Rhythm:Regular Rate:Normal  Not in atrial fib at time of exam, heart rate steady.    Neuro/Psych negative neurological ROS  negative psych ROS   GI/Hepatic negative GI ROS, Neg liver ROS,,,  Endo/Other  negative endocrine ROS    Renal/GU      Musculoskeletal   Abdominal   Peds  Hematology negative hematology ROS (+) Blood dyscrasia, anemia   Anesthesia Other Findings Previous cataract surgery 04-06-24 Dr. Ola  Paroxysmal supraventricular tachycardia Sick sinus syndrome with tachycardia  Metabolic syndrome Hyperlipidemia             Atrial fibrillation  GIB (gastrointestinal bleeding)      PMR (polymyalgia rheumatica)  Hypertension             Atrial fibrillation (HCC) Presence of permanent cardiac pacemaker       Sleep apnea Pre-diabetes             Anemia    Reproductive/Obstetrics negative OB ROS                              Anesthesia Physical Anesthesia Plan  ASA: 3  Anesthesia Plan: MAC   Post-op Pain Management:    Induction: Intravenous  PONV Risk Score and Plan: 1  Airway Management Planned: Natural Airway and Nasal Cannula  Additional Equipment:   Intra-op Plan:   Post-operative Plan:   Informed Consent: I have reviewed the patients History and Physical, chart, labs and discussed the procedure  including the risks, benefits and alternatives for the proposed anesthesia with the patient or authorized representative who has indicated his/her understanding and acceptance.     Dental Advisory Given  Plan Discussed with: Anesthesiologist, CRNA and Surgeon  Anesthesia Plan Comments: (Patient consented for risks of anesthesia including but not limited to:  - adverse reactions to medications - damage to eyes, teeth, lips or other oral mucosa - nerve damage due to positioning  - sore throat or hoarseness - Damage to heart, brain, nerves, lungs, other parts of body or loss of life  Patient voiced understanding and assent.)         Anesthesia Quick Evaluation

## 2024-04-16 NOTE — Discharge Instructions (Signed)

## 2024-04-20 ENCOUNTER — Encounter: Payer: Self-pay | Admitting: Ophthalmology

## 2024-04-20 ENCOUNTER — Ambulatory Visit
Admission: RE | Admit: 2024-04-20 | Discharge: 2024-04-20 | Disposition: A | Discharge status: Home / Self Care | Attending: Ophthalmology | Admitting: Ophthalmology

## 2024-04-20 ENCOUNTER — Ambulatory Visit: Payer: Self-pay | Admitting: Anesthesiology

## 2024-04-20 ENCOUNTER — Encounter
Admission: RE | Disposition: A | Discharge status: Home / Self Care | Payer: Self-pay | Source: Home / Self Care | Attending: Ophthalmology

## 2024-04-20 ENCOUNTER — Other Ambulatory Visit: Payer: Self-pay

## 2024-04-20 DIAGNOSIS — Z87891 Personal history of nicotine dependence: Secondary | ICD-10-CM | POA: Diagnosis not present

## 2024-04-20 DIAGNOSIS — G4730 Sleep apnea, unspecified: Secondary | ICD-10-CM | POA: Insufficient documentation

## 2024-04-20 DIAGNOSIS — Z5941 Food insecurity: Secondary | ICD-10-CM | POA: Insufficient documentation

## 2024-04-20 DIAGNOSIS — Z950 Presence of cardiac pacemaker: Secondary | ICD-10-CM | POA: Insufficient documentation

## 2024-04-20 DIAGNOSIS — H2511 Age-related nuclear cataract, right eye: Secondary | ICD-10-CM | POA: Diagnosis present

## 2024-04-20 DIAGNOSIS — I10 Essential (primary) hypertension: Secondary | ICD-10-CM | POA: Diagnosis not present

## 2024-04-20 DIAGNOSIS — D649 Anemia, unspecified: Secondary | ICD-10-CM | POA: Insufficient documentation

## 2024-04-20 HISTORY — PX: CATARACT EXTRACTION W/PHACO: SHX586

## 2024-04-20 LAB — GLUCOSE, CAPILLARY: Glucose-Capillary: 137 mg/dL — ABNORMAL HIGH (ref 70–99)

## 2024-04-20 SURGERY — PHACOEMULSIFICATION, CATARACT, WITH IOL INSERTION
Anesthesia: Monitor Anesthesia Care | Laterality: Right

## 2024-04-20 MED ORDER — SIGHTPATH DOSE#1 BSS IO SOLN
INTRAOCULAR | Status: DC | PRN
  Administered 2024-04-20: 87 mL via OPHTHALMIC

## 2024-04-20 MED ORDER — FENTANYL CITRATE (PF) 100 MCG/2ML IJ SOLN
INTRAMUSCULAR | Status: DC | PRN
  Administered 2024-04-20 (×2): 25 ug via INTRAVENOUS

## 2024-04-20 MED ORDER — TETRACAINE HCL 0.5 % OP SOLN
OPHTHALMIC | Status: AC
  Filled 2024-04-20: qty 4

## 2024-04-20 MED ORDER — LACTATED RINGERS IV SOLN: INTRAVENOUS | Status: DC

## 2024-04-20 MED ORDER — TETRACAINE HCL 0.5 % OP SOLN
1.0000 [drp] | OPHTHALMIC | Status: DC | PRN
  Administered 2024-04-20 (×3): 1 [drp] via OPHTHALMIC

## 2024-04-20 MED ORDER — SIGHTPATH DOSE#1 BSS IO SOLN
INTRAOCULAR | Status: DC | PRN
  Administered 2024-04-20: 15 mL via INTRAOCULAR

## 2024-04-20 MED ORDER — FENTANYL CITRATE (PF) 100 MCG/2ML IJ SOLN
INTRAMUSCULAR | Status: AC
  Filled 2024-04-20: qty 2

## 2024-04-20 MED ORDER — ARMC OPHTHALMIC DILATING DROPS
1.0000 | OPHTHALMIC | Status: DC | PRN
  Administered 2024-04-20 (×3): 1 via OPHTHALMIC

## 2024-04-20 MED ORDER — LIDOCAINE HCL (PF) 2 % IJ SOLN
INTRAOCULAR | Status: DC | PRN
  Administered 2024-04-20: 1 mL via INTRAOCULAR

## 2024-04-20 MED ORDER — SIGHTPATH DOSE#1 NA HYALUR & NA CHOND-NA HYALUR IO KIT
PACK | INTRAOCULAR | Status: DC | PRN
  Administered 2024-04-20: 1 via OPHTHALMIC

## 2024-04-20 MED ORDER — MOXIFLOXACIN HCL 0.5 % OP SOLN
OPHTHALMIC | Status: DC | PRN
  Administered 2024-04-20: .2 mL via OPHTHALMIC

## 2024-04-20 MED ORDER — ARMC OPHTHALMIC DILATING DROPS
OPHTHALMIC | Status: AC
  Filled 2024-04-20: qty 0.5

## 2024-04-20 SURGICAL SUPPLY — 9 items
DISSECTOR HYDRO NUCLEUS 50X22 (MISCELLANEOUS) ×1 IMPLANT
FEE CATARACT SUITE SIGHTPATH (MISCELLANEOUS) ×1 IMPLANT
GLOVE PI ULTRA LF STRL 7.5 (GLOVE) ×1 IMPLANT
GLOVE SURG SYN 6.5 PF PI BL (GLOVE) ×1 IMPLANT
GLOVE SURG SYN 8.5 PF PI BL (GLOVE) ×1 IMPLANT
LENS IOL TECNIS EYHANCE 21.5 (Intraocular Lens) IMPLANT
NDL FILTER BLUNT 18X1 1/2 (NEEDLE) ×1 IMPLANT
NEEDLE FILTER BLUNT 18X1 1/2 (NEEDLE) ×1 IMPLANT
SYR 3ML LL SCALE MARK (SYRINGE) ×1 IMPLANT

## 2024-04-20 NOTE — Anesthesia Postprocedure Evaluation (Signed)
 Anesthesia Post Note  Patient: [name] [name]  Procedure(s) Performed: PHACOEMULSIFICATION, CATARACT, WITH IOL INSERTION 4.71, 00:35.0 (Right)  Patient location during evaluation: PACU Anesthesia Type: MAC Level of consciousness: awake and alert Pain management: pain level controlled Vital Signs Assessment: post-procedure vital signs reviewed and stable Respiratory status: spontaneous breathing, nonlabored ventilation, respiratory function stable and patient connected to nasal cannula oxygen Cardiovascular status: blood pressure returned to baseline and stable Postop Assessment: no apparent nausea or vomiting Anesthetic complications: no   No notable events documented.   Last Vitals:  Vitals:   04/20/24 0752 04/20/24 0756  BP: 113/65 115/67  Pulse:  70  Resp:  11  Temp: 36.8 C   SpO2: 97% 97%    Last Pain:  Vitals:   04/20/24 0756  TempSrc:   PainSc: 0-No pain                 Debby Mines

## 2024-04-20 NOTE — Transfer of Care (Signed)
 Immediate Anesthesia Transfer of Care Note  Patient: [name] [name]  Procedure(s) Performed: PHACOEMULSIFICATION, CATARACT, WITH IOL INSERTION 4.71, 00:35.0 (Right)  Patient Location: PACU  Anesthesia Type: MAC  Level of Consciousness: awake, alert  and patient cooperative  Airway and Oxygen Therapy: Patient Spontanous Breathing and Patient connected to supplemental oxygen  Post-op Assessment: Post-op Vital signs reviewed, Patient's Cardiovascular Status Stable, Respiratory Function Stable, Patent Airway and No signs of Nausea or vomiting  Post-op Vital Signs: Reviewed and stable  Complications: No notable events documented.

## 2024-04-20 NOTE — Op Note (Signed)
 OPERATIVE NOTE  [name] [name] [id number] 04/20/2024   PREOPERATIVE DIAGNOSIS:  Nuclear sclerotic cataract right eye.  H25.11   POSTOPERATIVE DIAGNOSIS:    Nuclear sclerotic cataract right eye.     PROCEDURE:  Phacoemusification with posterior chamber intraocular lens placement of the right eye   LENS:   Implant Name Type Inv. Item Serial No. Manufacturer Lot No. LRB No. Used Action  LENS IOL TECNIS EYHANCE 21.5 - [id number] Intraocular Lens LENS IOL TECNIS EYHANCE 21.5 [id number] [employer]  Right 1 Implanted       Procedure(s): PHACOEMULSIFICATION, CATARACT, WITH IOL INSERTION 4.71, 00:35.0 (Right)  SURGEON:  Adine Novak, MD, MPH  ANESTHESIOLOGIST: Anesthesiologist: Leavy Ned, MD CRNA: Myra Lawless, CRNA   ANESTHESIA:  Topical with tetracaine  drops augmented with 1% preservative-free intracameral lidocaine .  ESTIMATED BLOOD LOSS: less than 1 mL.   COMPLICATIONS:  None.   DESCRIPTION OF PROCEDURE:  The patient was identified in the holding room and transported to the operating room and placed in the supine position under the operating microscope.  The right eye was identified as the operative eye and it was prepped and draped in the usual sterile ophthalmic fashion.   A 1.0 millimeter clear-corneal paracentesis was made at the 10:30 position. 0.5 ml of preservative-free 1% lidocaine  with epinephrine  was injected into the anterior chamber.  The anterior chamber was filled with viscoelastic.  A 2.4 millimeter keratome was used to make a near-clear corneal incision at the 8:00 position.  A curvilinear capsulorrhexis was made with a cystotome and capsulorrhexis forceps.  Balanced salt solution was used to hydrodissect and hydrodelineate the nucleus.   Phacoemulsification was then used in stop and chop fashion to remove the lens nucleus and epinucleus.  The remaining cortex was then removed using the irrigation and aspiration handpiece. Viscoelastic was then placed  into the capsular bag to distend it for lens placement.  A lens was then injected into the capsular bag.  The remaining viscoelastic was aspirated.   Wounds were hydrated with balanced salt solution.  The anterior chamber was inflated to a physiologic pressure with balanced salt solution.    Intracameral vigamox  0.1 mL undiluted was injected into the eye and a drop placed onto the ocular surface.  No wound leaks were noted.  The patient was taken to the recovery room in stable condition without complications of anesthesia or surgery  Adine Novak 04/20/2024, 7:49 AM

## 2024-04-20 NOTE — H&P (Signed)
 [facility]   Primary Care Physician:  Eliverto Bette Hover, MD Ophthalmologist: Dr. Adine Novak  Pre-Procedure History & Physical: HPI:  [name] [name] is a 89 y.o. male here for cataract surgery.   Past Medical History:  Diagnosis Date   Anemia    Atrial fibrillation (HCC)    Atrial fibrillation (HCC)    Biventricular cardiac pacemaker in situ    GIB (gastrointestinal bleeding)    With colonic AVMs S/P coiling   Hyperlipidemia    Hypertension    Metabolic syndrome    Paroxysmal supraventricular tachycardia (HCC)    PMR (polymyalgia rheumatica) (HCC)    Pre-diabetes    Presence of permanent cardiac pacemaker    Sick sinus syndrome with tachycardia (HCC)    Sleep apnea    Tachycardia     Past Surgical History:  Procedure Laterality Date   CARPAL TUNNEL RELEASE  1982   Right   CARPAL TUNNEL RELEASE  2000   Left   CATARACT EXTRACTION W/PHACO Left 04/06/2024   Procedure: PHACOEMULSIFICATION, CATARACT, WITH IOL INSERTION 6.43 00:43.4;  Surgeon: Novak Adine Anes, MD;  Location[facility];  Service: Ophthalmology;  Laterality: Left;   COLONOSCOPY WITH PROPOFOL  N/A 06/18/2016   Procedure: COLONOSCOPY WITH PROPOFOL ;  Surgeon: Lamar ONEIDA Holmes, MD;  Location: [facility] ENDOSCOPY;  Service: Endoscopy;  Laterality: N/A;   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 Authorizing Provider  acetaminophen  (TYLENOL ) 325 MG tablet Take 2 tablets (650 mg total) by mouth every 6 (six) hours as needed for mild pain or headache (or Fever >/= 101). 09/09/18  Yes Gouru, Aruna, MD  aspirin EC 81 MG tablet Take 81 mg by mouth daily.   Yes [provider]  ferrous sulfate 325 (65 FE) MG tablet Take 325 mg by mouth daily.   Yes [provider]  fish oil-omega-3 fatty acids 1000 MG capsule Take 1 g by mouth 2 (two) times daily.   Yes [provider]  furosemide  (LASIX ) 40 MG tablet Take 80 mg by mouth daily.    Yes [provider]  ibuprofen  (ADVIL ,MOTRIN ) 400 MG tablet Take 1 tablet (400 mg total) by mouth every 6 (six) hours as needed for moderate pain. 09/09/18  Yes Gouru, Aruna, MD  Lutein  6 MG CAPS Take 1 capsule by mouth daily.   Yes [provider]  metoprolol  tartrate (LOPRESSOR ) 25 MG tablet Take 37.5 mg by mouth 2 (two) times daily.   Yes [provider]  Multiple Vitamins-Minerals (CENTRUM SILVER PO) Take by mouth daily.   Yes [provider]  Multiple Vitamins-Minerals (ICAPS AREDS 2 PO) Take 1 capsule by mouth daily.   Yes [provider]  polyethylene glycol (MIRALAX  / GLYCOLAX ) packet Take 17 g by mouth daily as needed for moderate constipation. 09/09/18  Yes Gouru, Aruna, MD  rosuvastatin  (CRESTOR ) 5 MG tablet Take 2.5 mg by mouth every other day.    Yes [provider]  metFORMIN (GLUCOPHAGE-XR) 500 MG 24 hr tablet Take 500 mg by mouth 2 (two) times daily. Patient not taking: Reported on 03/24/2024    [provider]  Tiotropium Bromide  Monohydrate (SPIRIVA  RESPIMAT) 2.5 MCG/ACT AERS Inhale 2 puffs into the lungs daily.  Patient not taking: Reported on  03/24/2024    [provider]    Allergies as of 03/24/2024 - Review Complete 03/24/2024  Allergen Reaction Noted   Simvastatin   06/16/2013   Atorvastatin  06/16/2014   Warfarin  03/31/2012    Family History  Problem Relation Age of Onset   Cancer Other    Coronary artery disease Other    Heart disease Mother    Cancer Father        lymphatic    Social History   Socioeconomic History   Marital status: Married    Spouse name: Not on file   Number of children: Not on file   Years of education: Not on file   Highest education level: Not on file  Occupational  History   Occupation: Retired    Associate Professor: RETIRED  Tobacco Use   Smoking status: Former    Current packs/day: 0.00    Types: Cigarettes    Quit date: 08/27/1974    Years since quitting: 49.6   Smokeless tobacco: Never  Vaping Use   Vaping status: Never Used  Substance and Sexual Activity   Alcohol use: No   Drug use: No   Sexual activity: Not Currently    Birth control/protection: None  Other Topics Concern   Not on file  Social History Narrative   Married   Gets regular exercise   Social Drivers of Health   Financial Resource Strain: Low Risk  (01/15/2024)   Received from [facility]   Overall Financial Resource Strain (CARDIA)    Difficulty of Paying Living Expenses: Not very hard  Food Insecurity: Food Insecurity Present (01/15/2024)   Received from [facility]   Hunger Vital Sign    Within the past 12 months, you worried that your food would run out before you got the money to buy more.: Often true    Within the past 12 months, the food you bought just didn't last and you didn't have money to get more.: Often true  Transportation Needs: No Transportation Needs (01/15/2024)   Received from [facility] - Transportation    In the past 12 months, has lack of transportation kept you from medical appointments or from getting medications?: No    Lack of Transportation (Non-Medical): No  Physical Activity: Not on file  Stress: Not on file  Social Connections: Not on file  Intimate Partner Violence: Not on file    Review of Systems: See HPI, otherwise negative ROS  Physical Exam: BP 124/70   Pulse 88   Temp (!) 96.9 F (36.1 C) (Temporal)   Resp 17   Ht 6' 2 (1.88 m)   Wt 107.5 kg   SpO2 96%   BMI 30.43 kg/m  General:   Alert, cooperative. Head:  Normocephalic and atraumatic. Respiratory:  Normal work of breathing. Cardiovascular:  NAD  Impression/Plan: [name] [name] is here for cataract  surgery.  Risks, benefits, limitations, and alternatives regarding cataract surgery have been reviewed with the patient.  Questions have been answered.  All parties agreeable.   Adine Novak, MD  04/20/2024, 7:13 AM
